# Patient Record
Sex: Female | Born: 1975 | Race: White | Hispanic: No | Smoking: Former smoker
Health system: Southern US, Community
[De-identification: ages and names within clinical notes are randomized; demographics above are authoritative.]

## PROBLEM LIST (undated history)

## (undated) DIAGNOSIS — E119 Type 2 diabetes mellitus without complications: Secondary | ICD-10-CM

## (undated) DIAGNOSIS — E7800 Pure hypercholesterolemia, unspecified: Secondary | ICD-10-CM

## (undated) DIAGNOSIS — I10 Essential (primary) hypertension: Secondary | ICD-10-CM

## (undated) HISTORY — PX: BACK SURGERY: SHX140

---

## 2001-11-22 ENCOUNTER — Other Ambulatory Visit: Admission: RE | Admit: 2001-11-22 | Discharge: 2001-11-22 | Payer: Self-pay | Admitting: Family Medicine

## 2005-03-29 ENCOUNTER — Emergency Department: Payer: Self-pay | Admitting: Emergency Medicine

## 2005-05-17 ENCOUNTER — Ambulatory Visit: Payer: Self-pay

## 2005-06-08 ENCOUNTER — Emergency Department (HOSPITAL_COMMUNITY): Admission: EM | Admit: 2005-06-08 | Discharge: 2005-06-08 | Payer: Self-pay | Admitting: Emergency Medicine

## 2005-06-15 ENCOUNTER — Ambulatory Visit: Payer: Self-pay | Admitting: Family Medicine

## 2005-11-07 ENCOUNTER — Other Ambulatory Visit: Payer: Self-pay

## 2005-11-07 ENCOUNTER — Emergency Department: Payer: Self-pay | Admitting: Emergency Medicine

## 2008-02-19 ENCOUNTER — Ambulatory Visit: Payer: Self-pay | Admitting: Family Medicine

## 2008-02-22 ENCOUNTER — Emergency Department: Payer: Self-pay | Admitting: Emergency Medicine

## 2015-05-03 ENCOUNTER — Other Ambulatory Visit: Payer: Self-pay | Admitting: Nurse Practitioner

## 2015-05-03 ENCOUNTER — Ambulatory Visit
Admission: RE | Admit: 2015-05-03 | Discharge: 2015-05-03 | Disposition: A | Discharge status: Home / Self Care | Payer: PRIVATE HEALTH INSURANCE | Source: Ambulatory Visit | Attending: Nurse Practitioner | Admitting: Nurse Practitioner

## 2015-05-03 DIAGNOSIS — R1011 Right upper quadrant pain: Secondary | ICD-10-CM

## 2015-05-03 DIAGNOSIS — E118 Type 2 diabetes mellitus with unspecified complications: Secondary | ICD-10-CM | POA: Diagnosis present

## 2015-05-03 DIAGNOSIS — E781 Pure hyperglyceridemia: Secondary | ICD-10-CM | POA: Diagnosis present

## 2016-04-13 ENCOUNTER — Encounter: Payer: Self-pay | Admitting: Emergency Medicine

## 2016-04-13 DIAGNOSIS — Z5321 Procedure and treatment not carried out due to patient leaving prior to being seen by health care provider: Secondary | ICD-10-CM | POA: Insufficient documentation

## 2016-04-13 DIAGNOSIS — F17290 Nicotine dependence, other tobacco product, uncomplicated: Secondary | ICD-10-CM | POA: Insufficient documentation

## 2016-04-13 LAB — POCT PREGNANCY, URINE: Preg Test, Ur: NEGATIVE

## 2016-04-13 NOTE — ED Notes (Signed)
Pt presents to ED with right sided neck pain that she woke up with on Wednesday morning that has gotten progressively worse. Pt states her pain is now radiating into her right arm. Tonight pt reports she has been feeling nauseated and has a decrease in appetite. Feeling "dizzy and lightheaded".

## 2016-04-14 ENCOUNTER — Telehealth: Payer: Self-pay | Admitting: Emergency Medicine

## 2016-04-14 ENCOUNTER — Emergency Department
Admission: EM | Admit: 2016-04-14 | Discharge: 2016-04-14 | Disposition: A | Discharge status: Home / Self Care | Payer: PRIVATE HEALTH INSURANCE | Attending: Emergency Medicine | Admitting: Emergency Medicine

## 2016-04-14 HISTORY — DX: Pure hypercholesterolemia, unspecified: E78.00

## 2016-04-14 HISTORY — DX: Type 2 diabetes mellitus without complications: E11.9

## 2016-04-14 HISTORY — DX: Essential (primary) hypertension: I10

## 2016-04-14 LAB — COMPREHENSIVE METABOLIC PANEL
ALBUMIN: 3.9 g/dL (ref 3.5–5.0)
ALT: 10 U/L — ABNORMAL LOW (ref 14–54)
AST: 14 U/L — AB (ref 15–41)
Alkaline Phosphatase: 43 U/L (ref 38–126)
Anion gap: 7 (ref 5–15)
BUN: 14 mg/dL (ref 6–20)
CHLORIDE: 106 mmol/L (ref 101–111)
CO2: 26 mmol/L (ref 22–32)
Calcium: 9 mg/dL (ref 8.9–10.3)
Creatinine, Ser: 0.74 mg/dL (ref 0.44–1.00)
GFR calc Af Amer: >60 mL/min (ref >60)
GLUCOSE: 139 mg/dL — AB (ref 65–99)
POTASSIUM: 3.2 mmol/L — AB (ref 3.5–5.1)
SODIUM: 139 mmol/L (ref 135–145)
Total Bilirubin: 0.4 mg/dL (ref 0.3–1.2)
Total Protein: 7.6 g/dL (ref 6.5–8.1)

## 2016-04-14 LAB — CBC WITH DIFFERENTIAL/PLATELET
BASOS ABS: 0.1 10*3/uL (ref 0–0.1)
BASOS PCT: 1 %
EOS ABS: 0.1 10*3/uL (ref 0–0.7)
EOS PCT: 1 %
HCT: 38.4 % (ref 35.0–47.0)
Hemoglobin: 13.1 g/dL (ref 12.0–16.0)
Lymphocytes Relative: 48 %
Lymphs Abs: 5.2 10*3/uL — ABNORMAL HIGH (ref 1.0–3.6)
MCH: 29.1 pg (ref 26.0–34.0)
MCHC: 34.2 g/dL (ref 32.0–36.0)
MCV: 85.2 fL (ref 80.0–100.0)
MONO ABS: 0.9 10*3/uL (ref 0.2–0.9)
Monocytes Relative: 9 %
Neutro Abs: 4.4 10*3/uL (ref 1.4–6.5)
Neutrophils Relative %: 41 %
PLATELETS: 322 10*3/uL (ref 150–440)
RBC: 4.51 MIL/uL (ref 3.80–5.20)
RDW: 13.4 % (ref 11.5–14.5)
WBC: 10.7 10*3/uL (ref 3.6–11.0)

## 2016-04-14 LAB — TROPONIN I

## 2016-04-14 NOTE — ED Notes (Signed)
Called patient due to lwot to inquire about condition and follow up plans. Says her sx are much better today.  I advised her to call pcp or return here for exam.  I explained that light headedness and nausea and arm heaviness were still concerning, and that heart issues could not be ruled out with out a provider exam.  She agrees to call her pcp and advise of her symptoms and that labs are available in epic.

## 2018-03-04 ENCOUNTER — Ambulatory Visit
Admission: EM | Admit: 2018-03-04 | Discharge: 2018-03-04 | Disposition: A | Discharge status: Home / Self Care | Payer: 59 | Attending: Family Medicine | Admitting: Family Medicine

## 2018-03-04 ENCOUNTER — Encounter: Payer: Self-pay | Admitting: *Deleted

## 2018-03-04 DIAGNOSIS — M5412 Radiculopathy, cervical region: Secondary | ICD-10-CM | POA: Diagnosis not present

## 2018-03-04 MED ORDER — KETOROLAC TROMETHAMINE 10 MG PO TABS: 10.0000 mg | ORAL_TABLET | Freq: Three times a day (TID) | ORAL | 0 refills | Status: DC | PRN

## 2018-03-04 NOTE — ED Triage Notes (Signed)
C/o neck pain that radiates to right arm.  Pt c/o numbness to fingers . Pt rates pain 10/10.  Pain started Saturday.

## 2018-03-04 NOTE — ED Provider Notes (Signed)
[facility]    CSN: [id number] Arrival date & time: 03/04/18  1529     History   Chief Complaint Chief Complaint  Patient presents with  . Neck Injury    HPI Nicole Phillips is a 42 y.o. female.   42 yo female with a c/o 2 days of neck pain radiating down her right arm. Patient denies any injuries. States woke up yesterday morning with the neck pain. Denies any heavy lifting, falls, direct trauma. States pain is sharp radiating down her right arm with occasional tingling down the arm. 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    Neck Injury     Past Medical History:  Diagnosis Date  . Diabetes mellitus without complication (HCC)   . Hypercholesteremia   . Hypertension     There are no active problems to display for this patient.   Past Surgical History:  Procedure Laterality Date  . BACK SURGERY      OB History   None      Home Medications    Prior to Admission medications   Medication Sig Start Date End Date Taking? Authorizing Provider  divalproex (DEPAKOTE) 500 MG DR tablet Take 500 mg by mouth 2 (two) times daily.   Yes [provider]  metFORMIN (GLUCOPHAGE) 500 MG tablet Take by mouth 2 (two) times daily with a meal.   Yes [provider]  pioglitazone (ACTOS) 30 MG tablet Take 30 mg by mouth daily.   Yes [provider]  ketorolac (TORADOL) 10 MG tablet Take 1 tablet (10 mg total) by mouth every 8 (eight) hours as needed. 03/04/18   Payton Mccallum, MD    Family History History reviewed. No pertinent family history.  Social History Social History   Tobacco Use  . Smoking status: Former Games developer  . Smokeless tobacco: Current User  Substance Use Topics  . Alcohol use: No  . Drug use: No     Allergies   Sulfa antibiotics   Review of Systems Review of Systems   Physical Exam Triage Vital Signs ED Triage Vitals  Enc Vitals Group     BP 03/04/18  1547 129/67     Pulse Rate 03/04/18 1547 96     Resp 03/04/18 1547 18     Temp 03/04/18 1547 98.2 F (36.8 C)     Temp Source 03/04/18 1547 Oral     SpO2 03/04/18 1547 100 %     Weight 03/04/18 1544 230 lb (104.3 kg)     Height 03/04/18 1544  (1.753 m)     Head Circumference --      Peak Flow --      Pain Score 03/04/18 1543 10     Pain Loc --      Pain Edu? --      Excl. in GC? --    No data found.  Updated Vital Signs BP 129/67 (BP Location: Left Arm)   Pulse 96   Temp 98.2 F (36.8 C) (Oral)   Resp 18   Ht  (1.753 m)   Wt 230 lb (104.3 kg)   LMP 03/04/2018 (Exact Date)   SpO2 100%   BMI 33.97 kg/m   Visual Acuity Right Eye Distance:   Left Eye Distance:   Bilateral Distance:    Right Eye Near:   Left Eye Near:    Bilateral Near:     Physical Exam  Constitutional:  She is oriented to person, place, and time. She appears well-developed and well-nourished. No distress.  Cardiovascular: Intact distal pulses.  Musculoskeletal:       Cervical back: She exhibits decreased range of motion (secondary to pain), tenderness (mild over the cervical paraspinous) and pain. She exhibits no bony tenderness, no swelling, no edema, no deformity, no laceration, no spasm and normal pulse.  Neurological: She is alert and oriented to person, place, and time. She displays normal reflexes. No cranial nerve deficit or sensory deficit. She exhibits normal muscle tone. Coordination normal.  Skin: She is not diaphoretic.  Nursing note and vitals reviewed.    UC Treatments / Results  Labs (all labs ordered are listed, but only abnormal results are displayed) Labs Reviewed - No data to display  EKG None  Radiology No results found.  Procedures Procedures (including critical care time)  Medications Ordered in UC Medications - No data to display  Initial Impression / Assessment and Plan / UC Course  I have reviewed the triage vital signs and the nursing  notes.  Pertinent labs & imaging results that were available during my care of the patient were reviewed by me and considered in my medical decision making (see chart for details).      Final Clinical Impressions(s) / UC Diagnoses   Final diagnoses:  Acute cervical radiculopathy    ED Prescriptions    Medication Sig Dispense Auth. Provider   ketorolac (TORADOL) 10 MG tablet Take 1 tablet (10 mg total) by mouth every 8 (eight) hours as needed. 15 tablet Payton Mccallum, MD     1. diagnosis reviewed with patient 2. rx as per orders above; reviewed possible side effects, interactions, risks and benefits; patient refuses stronger pain medication or muscle relaxers 3. Recommend supportive treatment with heat to area 4. Follow-up prn if symptoms worsen or don't improve; discussed with patient if no improvement, she may need a referral from PCP for MRI and/or specialist   ontrolled Substance Prescriptions [place] Controlled Substance Registry consulted? Not Applicable   Payton Mccallum, MD 03/04/18 1650

## 2018-04-18 ENCOUNTER — Other Ambulatory Visit: Payer: Self-pay | Admitting: Orthopedic Surgery

## 2018-04-18 DIAGNOSIS — M5030 Other cervical disc degeneration, unspecified cervical region: Secondary | ICD-10-CM

## 2018-04-18 DIAGNOSIS — M5412 Radiculopathy, cervical region: Secondary | ICD-10-CM

## 2018-04-18 DIAGNOSIS — M50123 Cervical disc disorder at C6-C7 level with radiculopathy: Secondary | ICD-10-CM

## 2018-04-22 ENCOUNTER — Ambulatory Visit
Admission: RE | Admit: 2018-04-22 | Discharge: 2018-04-22 | Disposition: A | Discharge status: Home / Self Care | Payer: 59 | Source: Ambulatory Visit | Attending: Orthopedic Surgery | Admitting: Orthopedic Surgery

## 2018-04-22 DIAGNOSIS — M5412 Radiculopathy, cervical region: Secondary | ICD-10-CM

## 2018-04-22 DIAGNOSIS — M50123 Cervical disc disorder at C6-C7 level with radiculopathy: Secondary | ICD-10-CM

## 2018-04-22 DIAGNOSIS — M5030 Other cervical disc degeneration, unspecified cervical region: Secondary | ICD-10-CM

## 2018-05-07 ENCOUNTER — Other Ambulatory Visit: Payer: Self-pay | Admitting: Physical Medicine and Rehabilitation

## 2018-05-07 DIAGNOSIS — R9389 Abnormal findings on diagnostic imaging of other specified body structures: Secondary | ICD-10-CM

## 2018-05-10 ENCOUNTER — Ambulatory Visit: Admission: RE | Admit: 2018-05-10 | Payer: 59 | Source: Ambulatory Visit

## 2018-06-14 ENCOUNTER — Ambulatory Visit
Admission: RE | Admit: 2018-06-14 | Discharge: 2018-06-14 | Disposition: A | Discharge status: Home / Self Care | Payer: BLUE CROSS/BLUE SHIELD | Source: Ambulatory Visit | Attending: Physical Medicine and Rehabilitation | Admitting: Physical Medicine and Rehabilitation

## 2018-06-14 ENCOUNTER — Encounter (INDEPENDENT_AMBULATORY_CARE_PROVIDER_SITE_OTHER): Payer: Self-pay

## 2018-06-14 DIAGNOSIS — R590 Localized enlarged lymph nodes: Secondary | ICD-10-CM | POA: Diagnosis not present

## 2018-06-14 DIAGNOSIS — M8568 Other cyst of bone, other site: Secondary | ICD-10-CM | POA: Diagnosis not present

## 2018-06-14 DIAGNOSIS — R9389 Abnormal findings on diagnostic imaging of other specified body structures: Secondary | ICD-10-CM

## 2018-06-14 DIAGNOSIS — R937 Abnormal findings on diagnostic imaging of other parts of musculoskeletal system: Secondary | ICD-10-CM | POA: Insufficient documentation

## 2018-06-14 MED ORDER — IOPAMIDOL (ISOVUE-300) INJECTION 61%
75.0000 mL | Freq: Once | INTRAVENOUS | Status: AC | PRN
  Administered 2018-06-14: 75 mL via INTRAVENOUS

## 2018-06-17 ENCOUNTER — Other Ambulatory Visit: Payer: Self-pay

## 2018-06-17 ENCOUNTER — Encounter: Payer: Self-pay | Admitting: *Deleted

## 2018-06-17 ENCOUNTER — Emergency Department
Admission: EM | Admit: 2018-06-17 | Discharge: 2018-06-18 | Disposition: A | Discharge status: Home / Self Care | Payer: BLUE CROSS/BLUE SHIELD | Attending: Emergency Medicine | Admitting: Emergency Medicine

## 2018-06-17 DIAGNOSIS — S39012A Strain of muscle, fascia and tendon of lower back, initial encounter: Secondary | ICD-10-CM

## 2018-06-17 DIAGNOSIS — M62830 Muscle spasm of back: Secondary | ICD-10-CM | POA: Diagnosis not present

## 2018-06-17 DIAGNOSIS — X500XXA Overexertion from strenuous movement or load, initial encounter: Secondary | ICD-10-CM | POA: Diagnosis not present

## 2018-06-17 DIAGNOSIS — E119 Type 2 diabetes mellitus without complications: Secondary | ICD-10-CM | POA: Insufficient documentation

## 2018-06-17 DIAGNOSIS — M5441 Lumbago with sciatica, right side: Secondary | ICD-10-CM | POA: Insufficient documentation

## 2018-06-17 DIAGNOSIS — Z87891 Personal history of nicotine dependence: Secondary | ICD-10-CM | POA: Diagnosis not present

## 2018-06-17 DIAGNOSIS — Y9389 Activity, other specified: Secondary | ICD-10-CM | POA: Insufficient documentation

## 2018-06-17 DIAGNOSIS — Y990 Civilian activity done for income or pay: Secondary | ICD-10-CM | POA: Diagnosis not present

## 2018-06-17 DIAGNOSIS — Y9259 Other trade areas as the place of occurrence of the external cause: Secondary | ICD-10-CM | POA: Insufficient documentation

## 2018-06-17 DIAGNOSIS — I10 Essential (primary) hypertension: Secondary | ICD-10-CM | POA: Diagnosis not present

## 2018-06-17 DIAGNOSIS — Z7984 Long term (current) use of oral hypoglycemic drugs: Secondary | ICD-10-CM | POA: Insufficient documentation

## 2018-06-17 DIAGNOSIS — M5489 Other dorsalgia: Secondary | ICD-10-CM | POA: Diagnosis present

## 2018-06-17 NOTE — ED Triage Notes (Addendum)
Pt reports herniated c6 and c7. Mri approx 1 month ago.    Pt has lower back pain today. Pt was lifting water.  Denies urinary sx  Pt alert.

## 2018-06-17 NOTE — ED Notes (Signed)
Pt states that she moved something at work this morning and hurt her lower back. States that she couldn't even drive home after work today and she has been Surveyor, mineralslaying in bed all day. Reports that her C6- and C7 is herniated and she had an epidural on two Fridays ago. Pt is alert and oriented x4. Family at bedside

## 2018-06-18 ENCOUNTER — Emergency Department: Payer: BLUE CROSS/BLUE SHIELD

## 2018-06-18 LAB — POCT PREGNANCY, URINE: Preg Test, Ur: NEGATIVE

## 2018-06-18 MED ORDER — LIDOCAINE 5 % EX PTCH
1.0000 | MEDICATED_PATCH | CUTANEOUS | Status: DC
  Filled 2018-06-18: qty 1

## 2018-06-18 MED ORDER — KETOROLAC TROMETHAMINE 60 MG/2ML IM SOLN
60.0000 mg | Freq: Once | INTRAMUSCULAR | Status: DC
  Filled 2018-06-18: qty 2

## 2018-06-18 MED ORDER — PREDNISONE 10 MG (21) PO TBPK: ORAL_TABLET | ORAL | 0 refills | Status: DC

## 2018-06-18 MED ORDER — LIDOCAINE 5 % EX PTCH: 1.0000 | MEDICATED_PATCH | Freq: Two times a day (BID) | CUTANEOUS | 0 refills | Status: AC

## 2018-06-18 NOTE — Discharge Instructions (Addendum)
Please follow-up with your primary care physician for further evaluation of your back pain. °

## 2018-06-18 NOTE — ED Provider Notes (Signed)
[facility] Emergency Department Provider Note   ____________________________________________   First MD Initiated Contact with Patient 06/18/18 0040     (approximate)  I have reviewed the triage vital signs and the nursing notes.   HISTORY  Chief Complaint Back Pain    HPI Nicole Phillips is a 42 y.o. female who comes into the hospital today with some back pain.  The patient is here with her son but reports that at work today she injured her back.  The patient states that she moved something that she should not have moved and her back has been getting progressively worse since she injured it.  The patient took some meloxicam this morning but reports that she still been having pain burning down the front of her leg into the mid calf.  The patient states that she overdid it at work as well.  The patient rates her pain an 8 out of 10 intensity.  She has muscle relaxers at home but she does not like taking it.  The patient is mainly concerned about a possible fracture which is why she came into the hospital tonight.  She states that it hurts when she tries to stand or sit on her right leg.  It is also worse to move.   Past Medical History:  Diagnosis Date  . Diabetes mellitus without complication (HCC)   . Hypercholesteremia   . Hypertension     There are no active problems to display for this patient.   Past Surgical History:  Procedure Laterality Date  . BACK SURGERY      Prior to Admission medications   Medication Sig Start Date End Date Taking? Authorizing Provider  divalproex (DEPAKOTE) 500 MG DR tablet Take 500 mg by mouth 2 (two) times daily.    [provider]  ketorolac (TORADOL) 10 MG tablet Take 1 tablet (10 mg total) by mouth every 8 (eight) hours as needed. 03/04/18   Payton Mccallum, MD  lidocaine (LIDODERM) 5 % Place 1 patch onto the skin every 12 (twelve) hours. Remove & Discard patch within 12 hours or as directed by MD 06/18/18  06/18/19  Rebecka Apley, MD  metFORMIN (GLUCOPHAGE) 500 MG tablet Take by mouth 2 (two) times daily with a meal.    [provider]  pioglitazone (ACTOS) 30 MG tablet Take 30 mg by mouth daily.    [provider]  predniSONE (STERAPRED UNI-PAK 21 TAB) 10 MG (21) TBPK tablet Take 6 tabs on day 1 Take 5 tabs on day 2 Take 4 tabs on day 3 Take 3 tabs on day 4 Take 2 tabs on day 5 Take 1 tab on day 6 06/18/18   Rebecka Apley, MD    Allergies Sulfa antibiotics  No family history on file.  Social History Social History   Tobacco Use  . Smoking status: Former Games developer  . Smokeless tobacco: Current User  Substance Use Topics  . Alcohol use: No  . Drug use: No    Review of Systems  Constitutional: No fever/chills Eyes: No visual changes. ENT: No sore throat. Cardiovascular: Denies chest pain. Respiratory: Denies shortness of breath. Gastrointestinal: No abdominal pain.  No nausea, no vomiting.  No diarrhea.  No constipation. Genitourinary: Negative for dysuria. Musculoskeletal:  back pain. Skin: Negative for rash. Neurological: Negative for headaches, focal weakness or numbness.   ____________________________________________   PHYSICAL EXAM:  VITAL SIGNS: ED Triage Vitals  Enc Vitals Group     BP 06/17/18  2307 (!) 127/91     Pulse Rate 06/17/18 2307 85     Resp 06/17/18 2307 20     Temp 06/17/18 2307 97.7 F (36.5 C)     Temp Source 06/17/18 2307 Oral     SpO2 06/17/18 2307 98 %     Weight 06/17/18 2308 223 lb (101.2 kg)     Height 06/17/18 2308 5\' 9"  (1.753 m)     Head Circumference --      Peak Flow --      Pain Score 06/17/18 2308 10     Pain Loc --      Pain Edu? --      Excl. in GC? --     Constitutional: Alert and oriented. Well appearing and in moderate distress. Eyes: Conjunctivae are normal. PERRL. EOMI. Head: Atraumatic. Nose: No congestion/rhinnorhea. Mouth/Throat: Mucous membranes are moist.  Oropharynx  non-erythematous. Cardiovascular: Normal rate, regular rhythm. Grossly normal heart sounds.  Good peripheral circulation. Respiratory: Normal respiratory effort.  No retractions. Lungs CTAB. Gastrointestinal: Soft and nontender. No distention. Positive bowel sounds Musculoskeletal: Tenderness to palpation of lower lumbar spine with some positive straight leg raise on the right patient also has some mild SI joint tenderness to palpation Neurologic:  Normal speech and language.  No weakness to right lower extremity but pain with movement Skin:  Skin is warm, dry and intact.  Psychiatric: Mood and affect are normal.   ____________________________________________   LABS (all labs ordered are listed, but only abnormal results are displayed)  Labs Reviewed  POCT PREGNANCY, URINE   ____________________________________________  EKG  none ____________________________________________  RADIOLOGY  ED MD interpretation: Lumbar spine x-ray: Negative  Official radiology report(s): Dg Lumbar Spine 2-3 Views  Result Date: 06/18/2018 CLINICAL DATA:  Low back pain today after lifting water. EXAM: LUMBAR SPINE - 2-3 VIEW COMPARISON:  None. FINDINGS: There is no evidence of lumbar spine fracture. Alignment is normal. Intervertebral disc spaces are maintained. IMPRESSION: Negative. Electronically Signed   By: Burman NievesWilliam  Stevens M.D.   On: 06/18/2018 02:40    ____________________________________________   PROCEDURES  Procedure(s) performed: None  Procedures  Critical Care performed: No  ____________________________________________   INITIAL IMPRESSION / ASSESSMENT AND PLAN / ED COURSE  As part of my medical decision making, I reviewed the following data within the electronic MEDICAL RECORD NUMBER Notes from prior ED visits and [place] Controlled Substance Database   This is a 42 year old female who comes into the hospital today after injuring her back at work.  The patient states that she is  concerned about possible fracture given her discomfort.  I did order a Lidoderm patch and Toradol for the patient but the patient refused the Toradol.  I did perform an x-ray which did not show any fractures.  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  I will discharge the patient with some steroids as well as Lidoderm patches.  She is to follow back up with her primary care physician.      ____________________________________________   FINAL CLINICAL IMPRESSION(S) / ED DIAGNOSES  Final diagnoses:  Acute bilateral low back pain with right-sided sciatica  Muscle spasm of back  Strain of lumbar region, initial encounter     ED Discharge Orders         Ordered    lidocaine (LIDODERM) 5 %  Every 12 hours     06/18/18 0258    predniSONE (STERAPRED UNI-PAK 21 TAB) 10 MG (21) TBPK tablet  06/18/18 0258           Note:  This document was prepared using Dragon voice recognition software and may include unintentional dictation errors.    Rebecka ApleyWebster, Allison P, MD 06/18/18 [phone]

## 2020-09-14 ENCOUNTER — Other Ambulatory Visit: Payer: Self-pay | Admitting: Nurse Practitioner

## 2020-09-14 DIAGNOSIS — Z1231 Encounter for screening mammogram for malignant neoplasm of breast: Secondary | ICD-10-CM

## 2020-10-20 ENCOUNTER — Ambulatory Visit
Admission: RE | Admit: 2020-10-20 | Discharge: 2020-10-20 | Disposition: A | Discharge status: Home / Self Care | Payer: BC Managed Care – PPO | Source: Ambulatory Visit | Attending: Nurse Practitioner | Admitting: Nurse Practitioner

## 2020-10-20 ENCOUNTER — Other Ambulatory Visit: Payer: Self-pay

## 2020-10-20 DIAGNOSIS — Z1231 Encounter for screening mammogram for malignant neoplasm of breast: Secondary | ICD-10-CM | POA: Diagnosis present

## 2020-11-12 ENCOUNTER — Ambulatory Visit
Admission: EM | Admit: 2020-11-12 | Discharge: 2020-11-12 | Disposition: A | Discharge status: Home / Self Care | Payer: BC Managed Care – PPO | Attending: Physician Assistant | Admitting: Physician Assistant

## 2020-11-12 ENCOUNTER — Other Ambulatory Visit: Payer: Self-pay

## 2020-11-12 ENCOUNTER — Encounter: Payer: Self-pay | Admitting: Emergency Medicine

## 2020-11-12 DIAGNOSIS — N3090 Cystitis, unspecified without hematuria: Secondary | ICD-10-CM | POA: Diagnosis not present

## 2020-11-12 DIAGNOSIS — M545 Low back pain, unspecified: Secondary | ICD-10-CM

## 2020-11-12 DIAGNOSIS — K2970 Gastritis, unspecified, without bleeding: Secondary | ICD-10-CM | POA: Diagnosis not present

## 2020-11-12 DIAGNOSIS — M546 Pain in thoracic spine: Secondary | ICD-10-CM

## 2020-11-12 DIAGNOSIS — K2990 Gastroduodenitis, unspecified, without bleeding: Secondary | ICD-10-CM

## 2020-11-12 LAB — URINALYSIS, COMPLETE (UACMP) WITH MICROSCOPIC
Bilirubin Urine: NEGATIVE
Glucose, UA: 500 mg/dL — AB
Nitrite: NEGATIVE
Protein, ur: NEGATIVE mg/dL
RBC / HPF: NONE SEEN RBC/hpf (ref 0–5)
Specific Gravity, Urine: <1.005 — ABNORMAL LOW (ref 1.005–1.030)
pH: 5.5 (ref 5.0–8.0)

## 2020-11-12 MED ORDER — NITROFURANTOIN MONOHYD MACRO 100 MG PO CAPS: 100.0000 mg | ORAL_CAPSULE | Freq: Two times a day (BID) | ORAL | 0 refills | Status: DC

## 2020-11-12 MED ORDER — FLUCONAZOLE 150 MG PO TABS: 150.0000 mg | ORAL_TABLET | Freq: Every day | ORAL | 0 refills | Status: DC

## 2020-11-12 NOTE — ED Provider Notes (Signed)
[facility]    CSN: [id number] Arrival date & time: 11/12/20  1416      History   Chief Complaint Chief Complaint  Patient presents with  . Back Pain    HPI Nicole Phillips is a 45 y.o. female who started having burning like pain across lower lumbar-sacral region 2 days ago and denies injuring herself. Then today started pain on the R mid thorax area which is described as tightness and is constant. Her boss advised her to get a UA chekced. Has hx of asymptomatic UTls. She denies dysuria, frequency more than usual from her DM or urgency Had neg covid test yesterday  Past Medical History:  Diagnosis Date  . Diabetes mellitus without complication (HCC)   . Hypercholesteremia   . Hypertension     There are no problems to display for this patient.   Past Surgical History:  Procedure Laterality Date  . BACK SURGERY      OB History   No obstetric history on file.      Home Medications    Prior to Admission medications   Medication Sig Start Date End Date Taking? Authorizing Provider  atorvastatin (LIPITOR) 10 MG tablet Take 10 mg by mouth daily. 08/31/20  Yes [provider]  busPIRone (BUSPAR) 5 MG tablet TAKE 1 TABLET BY MOUTH EVERY DAY AT NIGHT 05/26/20  Yes [provider]  divalproex (DEPAKOTE) 500 MG DR tablet Take 500 mg by mouth 2 (two) times daily.   Yes [provider]  Dulaglutide (TRULICITY) 0.75 MG/0.5ML SOPN INJECT 0.5 MLS (0.75 MG TOTAL) SUBCUTANEOUSLY ONCE A WEEK 07/23/20  Yes [provider]  fluconazole (DIFLUCAN) 150 MG tablet Take 1 tablet (150 mg total) by mouth daily. 11/12/20  Yes Rodriguez-Southworth, Nettie Elm, PA-C  lisinopril (ZESTRIL) 10 MG tablet Take 1 tablet by mouth daily. 08/31/20  Yes [provider]  metFORMIN (GLUCOPHAGE) 500 MG tablet Take by mouth 2 (two) times daily with a meal.   Yes [provider]  nitrofurantoin, macrocrystal-monohydrate, (MACROBID) 100 MG capsule Take 1  capsule (100 mg total) by mouth 2 (two) times daily. 11/12/20  Yes Rodriguez-Southworth, Nettie Elm, PA-C  pioglitazone (ACTOS) 30 MG tablet Take 30 mg by mouth daily.   Yes [provider]    Family History History reviewed. No pertinent family history.  Social History Social History   Tobacco Use  . Smoking status: Former Games developer  . Smokeless tobacco: Current User  Vaping Use  . Vaping Use: Never used  Substance Use Topics  . Alcohol use: No  . Drug use: No     Allergies   Sulfa antibiotics   Review of Systems Review of Systems  Constitutional: Negative for activity change, appetite change, chills, diaphoresis and fever.  HENT: Negative for congestion.   Respiratory: Negative for cough.   Gastrointestinal: Positive for abdominal pain and constipation. Negative for diarrhea, nausea and vomiting.       Has felt a lot of bloating this past week and discomfort on upper abdomen. Used to take Prilosec in the past, but has not in a while. Has not noticed heart burn lately.   Genitourinary: Negative for difficulty urinating, dysuria, flank pain, frequency, hematuria and vaginal discharge.  Musculoskeletal: Positive for back pain. Negative for gait problem and myalgias.  Skin: Negative for rash.  Neurological: Negative for weakness and numbness.     Physical Exam Triage Vital Signs ED Triage Vitals  Enc Vitals Group     BP 11/12/20 1557 133/85  Pulse Rate 11/12/20 1557 83     Resp 11/12/20 1557 18     Temp 11/12/20 1557 98.1 F (36.7 C)     Temp Source 11/12/20 1557 Oral     SpO2 11/12/20 1557 100 %     Weight 11/12/20 1555 251 lb (113.9 kg)     Height 11/12/20 1555 5\' 9"  (1.753 m)     Head Circumference --      Peak Flow --      Pain Score 11/12/20 1554 7     Pain Loc --      Pain Edu? --      Excl. in [place]? --    No data found.  Updated Vital Signs BP 133/85 (BP Location: Left Arm)   Pulse 83   Temp 98.1 F (36.7 C) (Oral)   Resp 18   Ht 5\' 9"  (1.753  m)   Wt 251 lb (113.9 kg)   LMP 10/25/2020   SpO2 100%   BMI 37.07 kg/m   Visual Acuity Right Eye Distance:   Left Eye Distance:   Bilateral Distance:    Right Eye Near:   Left Eye Near:    Bilateral Near:    Physical Exam Vitals and nursing note reviewed.  Constitutional:      General: She is not in acute distress.    Appearance: She is not toxic-appearing.  HENT:     Head: Normocephalic.     Right Ear: External ear normal.     Left Ear: External ear normal.  Eyes:     General: No scleral icterus.    Conjunctiva/sclera: Conjunctivae normal.  Pulmonary:     Effort: Pulmonary effort is normal.  Abdominal:     General: Bowel sounds are normal.     Palpations: Abdomen is soft. There is no mass.     Tenderness: Has mild epigastric tenderness. There is no guarding or rebound.     Comments: neg CVA tenderness   Musculoskeletal:     Comments: BACK- has mild tenderness across sacral region, but more tender and tense on R mid thoracic muscular  Region. Spine ROM is normal, but L lateral felxion and anterior flexion >80 degrees provoked her pain. Has neg SLR   Skin:    General: Skin is warm and dry.     Findings: No rash.  Neurological:     Mental Status: She is alert and oriented to person, place, and time.     Gait: Gait normal.  Psychiatric:        Mood and Affect: Mood normal.        Behavior: Behavior normal.        Thought Content: Thought content normal.        Judgment: Judgment normal.    UC Treatments / Results  Labs (all labs ordered are listed, but only abnormal results are displayed) Labs Reviewed  URINALYSIS, COMPLETE (UACMP) WITH MICROSCOPIC - Abnormal; Notable for the following components:      Result Value   APPearance HAZY (*)    Specific Gravity, Urine <1.005 (*)    Glucose, UA 500 (*)    Hgb urine dipstick TRACE (*)    Ketones, ur TRACE (*)    Leukocytes,Ua MODERATE (*)    Bacteria, UA FEW (*)    All other components within normal limits     EKG   Radiology No results found.  Procedures Procedures (including critical care time)  Medications Ordered in UC Medications - No data to display  Initial Impression / Assessment and Plan / UC Course  I have reviewed the triage vital signs and the nursing notes. Has muscular R mid thoracic pain and lumbo-sacral pain, gastritis and possibly UTI I sent urine for a culture. She was placed on Macrobid and diflucan as noted. She declined muscle relaxer's. Will do ice and heat and stretches I taught her today.  I advised her to restart her Prilosec for a couple of week to see if her epigastric pain improved. See instructions  Final Clinical Impressions(s) / UC Diagnoses   Final diagnoses:  Cystitis  Gastritis and gastroduodenitis     Discharge Instructions     Get back to your prilosec for 2 weeks for the stomach pain and follow up with your doctor  if you are not better in regards to upper abdomen pain    ED Prescriptions    Medication Sig Dispense Auth. Provider   nitrofurantoin, macrocrystal-monohydrate, (MACROBID) 100 MG capsule Take 1 capsule (100 mg total) by mouth 2 (two) times daily. 14 capsule Rodriguez-Southworth, Bernadett Milian, PA-C   fluconazole (DIFLUCAN) 150 MG tablet Take 1 tablet (150 mg total) by mouth daily. 2 tablet Rodriguez-Southworth, Nettie Elm, PA-C     PDMP not reviewed this encounter.   Garey Ham, [place] 11/13/20 [phone]

## 2020-11-12 NOTE — ED Triage Notes (Signed)
Patient c/o back pain that started 2 days ago. She denies urinary symptoms but states her PCP told her to come here to be checked for a UTI.

## 2020-11-12 NOTE — Discharge Instructions (Signed)
Get back to your prilosec for 2 weeks for the stomach pain and follow up with your doctor  if you are not better in regards to upper abdomen pain

## 2020-12-07 ENCOUNTER — Ambulatory Visit (INDEPENDENT_AMBULATORY_CARE_PROVIDER_SITE_OTHER): Payer: BC Managed Care – PPO

## 2020-12-07 ENCOUNTER — Ambulatory Visit
Admission: RE | Admit: 2020-12-07 | Discharge: 2020-12-07 | Disposition: A | Discharge status: Home / Self Care | Payer: BC Managed Care – PPO | Source: Ambulatory Visit | Attending: Physician Assistant | Admitting: Physician Assistant

## 2020-12-07 ENCOUNTER — Other Ambulatory Visit: Payer: Self-pay

## 2020-12-07 VITALS — BP 136/85 | HR 95 | Temp 98.2°F | Resp 18 | Ht 69.0 in | Wt 244.0 lb

## 2020-12-07 DIAGNOSIS — U071 COVID-19: Secondary | ICD-10-CM | POA: Diagnosis not present

## 2020-12-07 DIAGNOSIS — J1282 Pneumonia due to coronavirus disease 2019: Secondary | ICD-10-CM | POA: Insufficient documentation

## 2020-12-07 DIAGNOSIS — R0602 Shortness of breath: Secondary | ICD-10-CM | POA: Insufficient documentation

## 2020-12-07 DIAGNOSIS — R000 Tachycardia, unspecified: Secondary | ICD-10-CM | POA: Diagnosis not present

## 2020-12-07 DIAGNOSIS — R059 Cough, unspecified: Secondary | ICD-10-CM | POA: Insufficient documentation

## 2020-12-07 LAB — COMPREHENSIVE METABOLIC PANEL
ALT: 20 U/L (ref 0–44)
AST: 23 U/L (ref 15–41)
Albumin: 3.7 g/dL (ref 3.5–5.0)
Alkaline Phosphatase: 61 U/L (ref 38–126)
Anion gap: 10 (ref 5–15)
BUN: 13 mg/dL (ref 6–20)
CO2: 26 mmol/L (ref 22–32)
Calcium: 8.6 mg/dL — ABNORMAL LOW (ref 8.9–10.3)
Chloride: 99 mmol/L (ref 98–111)
Creatinine, Ser: 0.82 mg/dL (ref 0.44–1.00)
GFR, Estimated: >60 mL/min (ref >60)
Glucose, Bld: 253 mg/dL — ABNORMAL HIGH (ref 70–99)
Potassium: 3.7 mmol/L (ref 3.5–5.1)
Sodium: 135 mmol/L (ref 135–145)
Total Bilirubin: 0.4 mg/dL (ref 0.3–1.2)
Total Protein: 8 g/dL (ref 6.5–8.1)

## 2020-12-07 LAB — CBC WITH DIFFERENTIAL/PLATELET
Abs Immature Granulocytes: 0.05 10*3/uL (ref 0.00–0.07)
Basophils Absolute: 0 10*3/uL (ref 0.0–0.1)
Basophils Relative: 0 %
Eosinophils Absolute: 0.1 10*3/uL (ref 0.0–0.5)
Eosinophils Relative: 1 %
HCT: 41.1 % (ref 36.0–46.0)
Hemoglobin: 13.1 g/dL (ref 12.0–15.0)
Immature Granulocytes: 1 %
Lymphocytes Relative: 42 %
Lymphs Abs: 3.1 10*3/uL (ref 0.7–4.0)
MCH: 27.8 pg (ref 26.0–34.0)
MCHC: 31.9 g/dL (ref 30.0–36.0)
MCV: 87.1 fL (ref 80.0–100.0)
Monocytes Absolute: 0.8 10*3/uL (ref 0.1–1.0)
Monocytes Relative: 10 %
Neutro Abs: 3.4 10*3/uL (ref 1.7–7.7)
Neutrophils Relative %: 46 %
Platelets: 297 10*3/uL (ref 150–400)
RBC: 4.72 MIL/uL (ref 3.87–5.11)
RDW: 14.2 % (ref 11.5–15.5)
WBC: 7.4 10*3/uL (ref 4.0–10.5)
nRBC: 0 % (ref 0.0–0.2)

## 2020-12-07 LAB — GLUCOSE, CAPILLARY: Glucose-Capillary: 227 mg/dL — ABNORMAL HIGH (ref 70–99)

## 2020-12-07 MED ORDER — ALBUTEROL SULFATE HFA 108 (90 BASE) MCG/ACT IN AERS: 1.0000 | INHALATION_SPRAY | Freq: Four times a day (QID) | RESPIRATORY_TRACT | 0 refills | Status: AC | PRN

## 2020-12-07 NOTE — Discharge Instructions (Signed)
Your x-ray shows COVID pneumonia. This is treated with supportive care. Increase rest and fluids. Use inhaler if you are feeling short of breath. Check O2 and if below 93% call EMS or go to ED. Can take Mucinex to help break up mucus.  COVID-19 INFECTION: The incubation period of COVID-19 is approximately 14 days after exposure, with most symptoms developing in roughly 4-5 days. Symptoms may range in severity from mild to critically severe. Roughly 80% of those infected will have mild symptoms. People of any age may become infected with COVID-19 and have the ability to transmit the virus. The most common symptoms include: fever, fatigue, cough, body aches, headaches, sore throat, nasal congestion, shortness of breath, nausea, vomiting, diarrhea, changes in smell and/or taste.    COURSE OF ILLNESS Some patients may begin with mild disease which can progress quickly into critical symptoms. If your symptoms are worsening please call ahead to the Emergency Department and proceed there for further treatment. Recovery time appears to be roughly 1-2 weeks for mild symptoms and 3-6 weeks for severe disease.   GO IMMEDIATELY TO ER FOR FEVER YOU ARE UNABLE TO GET DOWN WITH TYLENOL, BREATHING PROBLEMS, CHEST PAIN, FATIGUE, LETHARGY, INABILITY TO EAT OR DRINK, ETC  QUARANTINE AND ISOLATION: To help decrease the spread of COVID-19 please remain isolated if you have COVID infection or are highly suspected to have COVID infection. This means -stay home and isolate to one room in the home if you live with others. Do not share a bed or bathroom with others while ill, sanitize and wipe down all countertops and keep common areas clean and disinfected. Stay home for 5 days. If you have no symptoms or your symptoms are resolving after 5 days, you can leave your house. Continue to wear a mask around others for 5 additional days. If you have been in close contact (within 6 feet) of someone diagnosed with COVID 19, you are  advised to quarantine in your home for 14 days as symptoms can develop anywhere from 2-14 days after exposure to the virus. If you develop symptoms, you  must isolate.  Most current guidelines for COVID after exposure -unvaccinated: isolate 5 days and strict mask use x 5 days. Test on day 5 is possible -vaccinated: wear mask x 10 days if symptoms do not develop -You do not necessarily need to be tested for COVID if you have + exposure and  develop symptoms. Just isolate at home x10 days from symptom onset During this global pandemic, CDC advises to practice social distancing, try to stay at least 71ft away from others at all times. Wear a face covering. Wash and sanitize your hands regularly and avoid going anywhere that is not necessary.  KEEP IN MIND THAT THE COVID TEST IS NOT 100% ACCURATE AND YOU SHOULD STILL DO EVERYTHING TO PREVENT POTENTIAL SPREAD OF VIRUS TO OTHERS (WEAR MASK, WEAR GLOVES, WASH HANDS AND SANITIZE REGULARLY). IF INITIAL TEST IS NEGATIVE, THIS MAY NOT MEAN YOU ARE DEFINITELY NEGATIVE. MOST ACCURATE TESTING IS DONE 5-7 DAYS AFTER EXPOSURE.   It is not advised by CDC to get re-tested after receiving a positive COVID test since you can still test positive for weeks to months after you have already cleared the virus.   *If you have not been vaccinated for COVID, I strongly suggest you consider getting vaccinated as long as there are no contraindications.

## 2020-12-07 NOTE — ED Triage Notes (Signed)
Patient in today c/o sob and tachycardia x 1-2 days. Patient diagnosed with covid ~2 weeks ago. Patient states that she notices the symptoms worse when she exerts herself and when taking a shower.

## 2020-12-07 NOTE — ED Provider Notes (Addendum)
[facility]    CSN: [id number] Arrival date & time: 12/07/20  1440      History   Chief Complaint Chief Complaint  Patient presents with  . Appointment  . Shortness of Breath  . Tachycardia    HPI Nicole Phillips is a 45 y.o. female presenting for 1-2 day history of increased SOB and feeling like her heart is racing. She was diagnosed with COVID 19 about 2 weeks ago.  Patient says that she has been sick a lot over the past 3 to 4 weeks.  She states that she initially had a "stomach bug" at the beginning of January.  She says she has had diarrhea consistently over the past 3 weeks.  Patient states that she also had a UTI diagnosed at the beginning of January and was prescribed Macrobid.  She says that she went to see her PCP on 11/15/2020 and was prescribed Augmentin instead of the Macrobid for her UTI.  She took a full 10-day course.  She says that she started feeling little better and then ended up developing nasal congestion, cough, fatigue and feeling overall poorly.  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  Patient states that she was eventually tested for Covid on 11/29/2020 and the test was positive.  She believes that she was sick with Covid type symptoms about a week before that.  Admits to continued cough and congestion.  Admits to continued fatigue, diarrhea and feeling poorly overall.  She says that over the past couple of days she has felt little more short of breath than normal, but says it is not really that bad.  She says that she only feels short of breath if she is up and moving around, doing a lot of things.  Patient also says that she is aware that she is very obese and has a lot of medical problems and has been laying around since she has been sick.  Admits that she is not a very active person usually.  Not currently taking any medication for symptoms.  PMH significant for T2DM,  hypertension, hyperlipidemia, anxiety, and seizures. HTN controlled with lisinopril. Takes Lipitor for hyperlipidemia. T2DM treated with Actos, Metformin, and Trulicity. Seizures controlled with depakote. Takes Buspar for anxiety.   HPI  Past Medical History:  Diagnosis Date  . Diabetes mellitus without complication (HCC)   . Hypercholesteremia   . Hypertension     There are no problems to display for this patient.   Past Surgical History:  Procedure Laterality Date  . BACK SURGERY      OB History   No obstetric history on file.      Home Medications    Prior to Admission medications   Medication Sig Start Date End Date Taking? Authorizing Provider  albuterol (VENTOLIN HFA) 108 (90 Base) MCG/ACT inhaler Inhale 1-2 puffs into the lungs every 6 (six) hours as needed for wheezing or shortness of breath. 12/07/20 12/07/21 Yes Eusebio Friendly B, PA-C  atorvastatin (LIPITOR) 10 MG tablet Take 10 mg by mouth daily. 08/31/20  Yes [provider]  busPIRone (BUSPAR) 5 MG tablet TAKE 1 TABLET BY MOUTH EVERY DAY AT NIGHT 05/26/20  Yes [provider]  divalproex (DEPAKOTE) 500 MG DR tablet Take 500 mg by mouth 2 (two) times daily.   Yes [provider]  Dulaglutide (TRULICITY) 0.75 MG/0.5ML SOPN INJECT 0.5 MLS (0.75 MG TOTAL) SUBCUTANEOUSLY ONCE A WEEK  07/23/20  Yes [provider]  lisinopril (ZESTRIL) 10 MG tablet Take 1 tablet by mouth daily. 08/31/20  Yes [provider]  metFORMIN (GLUCOPHAGE) 500 MG tablet Take by mouth 2 (two) times daily with a meal.   Yes [provider]  pioglitazone (ACTOS) 30 MG tablet Take 30 mg by mouth daily.   Yes [provider]  fluconazole (DIFLUCAN) 150 MG tablet Take 1 tablet (150 mg total) by mouth daily. 11/12/20   Rodriguez-Southworth, Nettie Elm, PA-C  nitrofurantoin, macrocrystal-monohydrate, (MACROBID) 100 MG capsule Take 1 capsule (100 mg total) by mouth 2 (two) times daily. 11/12/20    Rodriguez-Southworth, Nettie Elm, PA-C    Family History Family History  Problem Relation Age of Onset  . Alzheimer's disease Mother   . Diabetes Mother   . Alzheimer's disease Father     Social History Social History   Tobacco Use  . Smoking status: Former Smoker    Quit date: 12/07/2010    Years since quitting: 10.0  . Smokeless tobacco: Never Used  Vaping Use  . Vaping Use: Never used  Substance Use Topics  . Alcohol use: No  . Drug use: No     Allergies   Sulfa antibiotics   Review of Systems Review of Systems  Constitutional: Positive for fatigue and fever (up to 100 degrees). Negative for chills and diaphoresis.  HENT: Positive for congestion. Negative for ear pain, rhinorrhea, sinus pressure, sinus pain and sore throat.   Respiratory: Positive for cough and shortness of breath.   Cardiovascular: Positive for palpitations. Negative for chest pain.  Gastrointestinal: Positive for vomiting. Negative for abdominal pain and nausea.  Genitourinary: Negative for difficulty urinating, hematuria and urgency.  Musculoskeletal: Negative for arthralgias and myalgias.  Skin: Negative for rash.  Neurological: Negative for weakness and headaches.  Hematological: Negative for adenopathy.  Psychiatric/Behavioral: The patient is nervous/anxious.      Physical Exam Triage Vital Signs ED Triage Vitals [12/07/20 1454]  Enc Vitals Group     BP 136/85     Pulse Rate 95     Resp 18     Temp 98.2 F (36.8 C)     Temp Source Oral     SpO2 100 %     Weight 244 lb (110.7 kg)     Height 5\' 9"  (1.753 m)     Head Circumference      Peak Flow      Pain Score 8     Pain Loc      Pain Edu?      Excl. in GC?    No data found.  Updated Vital Signs BP 136/85 (BP Location: Right Arm)   Pulse 95   Temp 98.2 F (36.8 C) (Oral)   Resp 18   Ht 5\' 9"  (1.753 m)   Wt 244 lb (110.7 kg)   LMP 11/30/2020 (Approximate)   SpO2 100%   BMI 36.03 kg/m       Physical Exam Vitals and  nursing note reviewed.  Constitutional:      General: She is not in acute distress.    Appearance: Normal appearance. She is obese. She is not ill-appearing, toxic-appearing or diaphoretic.  HENT:     Head: Normocephalic and atraumatic.     Nose: Nose normal.     Mouth/Throat:     Mouth: Mucous membranes are moist.     Pharynx: Oropharynx is clear.  Eyes:     General: No scleral icterus.  Right eye: No discharge.        Left eye: No discharge.     Conjunctiva/sclera: Conjunctivae normal.  Cardiovascular:     Rate and Rhythm: Normal rate and regular rhythm.     Heart sounds: Normal heart sounds. No murmur heard.   Pulmonary:     Effort: Pulmonary effort is normal. No respiratory distress.     Breath sounds: Normal breath sounds. No wheezing, rhonchi or rales.  Abdominal:     Palpations: Abdomen is soft.     Tenderness: There is no abdominal tenderness.  Musculoskeletal:     Cervical back: Neck supple.     Right lower leg: No edema.     Left lower leg: No edema.  Skin:    General: Skin is dry.  Neurological:     General: No focal deficit present.     Mental Status: She is alert. Mental status is at baseline.     Motor: No weakness.     Gait: Gait normal.  Psychiatric:        Mood and Affect: Mood is anxious.        Behavior: Behavior normal.        Thought Content: Thought content normal.      UC Treatments / Results  Labs (all labs ordered are listed, but only abnormal results are displayed) Labs Reviewed  GLUCOSE, CAPILLARY - Abnormal; Notable for the following components:      Result Value   Glucose-Capillary 227 (*)    All other components within normal limits  COMPREHENSIVE METABOLIC PANEL - Abnormal; Notable for the following components:   Glucose, Bld 253 (*)    Calcium 8.6 (*)    All other components within normal limits  CBC WITH DIFFERENTIAL/PLATELET  CBG MONITORING, ED    EKG   Radiology DG Chest 2 View  Result Date: 12/07/2020 CLINICAL  DATA:  Shortness of breath. EXAM: CHEST - 2 VIEW COMPARISON:  11/07/2005 FINDINGS: Patchy basilar airspace disease noted bilaterally, most prominently in the right middle lobe. Subtle nodularity seen over the left upper lung. No pulmonary edema or pleural effusion. The cardiopericardial silhouette is within normal limits for size. IMPRESSION: Patchy bibasilar airspace disease, most prominent in the right middle lobe, compatible with multifocal pneumonia. Nodular opacity over the left upper lung associated. Consider follow-up to ensure resolution. Electronically Signed   By: Kennith Center M.D.   On: 12/07/2020 15:42    Procedures ED EKG  Date/Time: 12/07/2020 3:14 PM Performed by: Shirlee Latch, PA-C Authorized by: Shirlee Latch, PA-C   ECG reviewed by ED Physician in the absence of a cardiologist: no   Previous ECG:    Previous ECG:  Unavailable Interpretation:    Interpretation: normal   Rate:    ECG rate:  86   ECG rate assessment: normal   Rhythm:    Rhythm: sinus rhythm   Ectopy:    Ectopy: none   QRS:    QRS axis:  Normal ST segments:    ST segments:  Normal T waves:    T waves: normal   Comments:     Normal sinus rhythm. Regular rate   (including critical care time)  Medications Ordered in UC Medications - No data to display  Initial Impression / Assessment and Plan / UC Course  I have reviewed the triage vital signs and the nursing notes.  Pertinent labs & imaging results that were available during my care of the patient were reviewed by me and  considered in my medical decision making (see chart for details).    45 year old female persisting COVID-19 symptoms.  Also admits to recent stomach bug and persistent diarrhea x3 to 4 weeks.  Concerns of elevated heart rate and feeling short of breath over the past couple days.  In the clinic all vital signs are normal and stable and patient is in no acute distress.  She is somewhat anxious on exam and does admit to that.   Her exam overall is benign.  Chest is clear to auscultation heart regular rate and rhythm.  EKG performed today shows normal sinus rhythm and regular rate.  Fingerstick glucose is 227.  A chest x-ray was performed which shows multifocal pneumonia consistent with COVID-19.  Independently reviewed chest x-ray.  CBC is within normal limits.  CMP only shows elevated glucose.  I did advise patient about getting stool studies since she has had ongoing diarrhea for 3 to 4 weeks and has been on multiple antibiotics.  Discussed that there are many causes for her diarrhea and she should consider testing for those infections including C. difficile.  Patient declines to give stool sample at this time.  Since she has been having a lot of diarrhea--admits to greater than 10 episodes per day sometimes--I did offer IV hydration, but she declines at this time and states that she is able to increase her liquids on her own.  Advised patient that she has Covid pneumonia.  She is already taken Augmentin and azithromycin which will cover bacterial causes but explained to her that this is a viral cause.  Suggested supportive care with increasing rest and fluids.  Also advised Mucinex.  Sent albuterol inhaler as needed for shortness of breath.  She does have a pulse ox at home to monitor her oxygen.  Discussed ED precautions with her and advised her to go to ED if she feels more short of breath or actions below 93%.  Advised her to follow-up with her PCP for repeat chest x-ray in the next couple weeks.  Advised following up sooner through telemedicine visit if possible.  Work note provided for the next couple days.  Thoroughly reviewed ED precautions with patient.   Final Clinical Impressions(s) / UC Diagnoses   Final diagnoses:  Pneumonia due to COVID-19 virus  Cough  Shortness of breath     Discharge Instructions     Your x-ray shows COVID pneumonia. This is treated with supportive care. Increase rest and fluids. Use  inhaler if you are feeling short of breath. Check O2 and if below 93% call EMS or go to ED. Can take Mucinex to help break up mucus.  COVID-19 INFECTION: The incubation period of COVID-19 is approximately 14 days after exposure, with most symptoms developing in roughly 4-5 days. Symptoms may range in severity from mild to critically severe. Roughly 80% of those infected will have mild symptoms. People of any age may become infected with COVID-19 and have the ability to transmit the virus. The most common symptoms include: fever, fatigue, cough, body aches, headaches, sore throat, nasal congestion, shortness of breath, nausea, vomiting, diarrhea, changes in smell and/or taste.    COURSE OF ILLNESS Some patients may begin with mild disease which can progress quickly into critical symptoms. If your symptoms are worsening please call ahead to the Emergency Department and proceed there for further treatment. Recovery time appears to be roughly 1-2 weeks for mild symptoms and 3-6 weeks for severe disease.   GO IMMEDIATELY TO ER FOR FEVER  YOU ARE UNABLE TO GET DOWN WITH TYLENOL, BREATHING PROBLEMS, CHEST PAIN, FATIGUE, LETHARGY, INABILITY TO EAT OR DRINK, ETC  QUARANTINE AND ISOLATION: To help decrease the spread of COVID-19 please remain isolated if you have COVID infection or are highly suspected to have COVID infection. This means -stay home and isolate to one room in the home if you live with others. Do not share a bed or bathroom with others while ill, sanitize and wipe down all countertops and keep common areas clean and disinfected. Stay home for 5 days. If you have no symptoms or your symptoms are resolving after 5 days, you can leave your house. Continue to wear a mask around others for 5 additional days. If you have been in close contact (within 6 feet) of someone diagnosed with COVID 19, you are advised to quarantine in your home for 14 days as symptoms can develop anywhere from 2-14 days after  exposure to the virus. If you develop symptoms, you  must isolate.  Most current guidelines for COVID after exposure -unvaccinated: isolate 5 days and strict mask use x 5 days. Test on day 5 is possible -vaccinated: wear mask x 10 days if symptoms do not develop -You do not necessarily need to be tested for COVID if you have + exposure and  develop symptoms. Just isolate at home x10 days from symptom onset During this global pandemic, CDC advises to practice social distancing, try to stay at least 28ft away from others at all times. Wear a face covering. Wash and sanitize your hands regularly and avoid going anywhere that is not necessary.  KEEP IN MIND THAT THE COVID TEST IS NOT 100% ACCURATE AND YOU SHOULD STILL DO EVERYTHING TO PREVENT POTENTIAL SPREAD OF VIRUS TO OTHERS (WEAR MASK, WEAR GLOVES, WASH HANDS AND SANITIZE REGULARLY). IF INITIAL TEST IS NEGATIVE, THIS MAY NOT MEAN YOU ARE DEFINITELY NEGATIVE. MOST ACCURATE TESTING IS DONE 5-7 DAYS AFTER EXPOSURE.   It is not advised by CDC to get re-tested after receiving a positive COVID test since you can still test positive for weeks to months after you have already cleared the virus.   *If you have not been vaccinated for COVID, I strongly suggest you consider getting vaccinated as long as there are no contraindications.      ED Prescriptions    Medication Sig Dispense Auth. Provider   albuterol (VENTOLIN HFA) 108 (90 Base) MCG/ACT inhaler Inhale 1-2 puffs into the lungs every 6 (six) hours as needed for wheezing or shortness of breath. 1 g Shirlee Latch, PA-C     PDMP not reviewed this encounter.   Shirlee Latch, PA-C 12/07/20 1812    Shirlee Latch, PA-C 12/10/20 [phone]

## 2020-12-31 IMAGING — MG DIGITAL SCREENING BILAT W/ TOMO W/ CAD
8 series · 8 of 24 positions shown · non-contrast
Comparison: None.

CLINICAL DATA: Screening.

EXAM:
DIGITAL SCREENING BILATERAL MAMMOGRAM WITH TOMO AND CAD

[L MLO synth-2D]
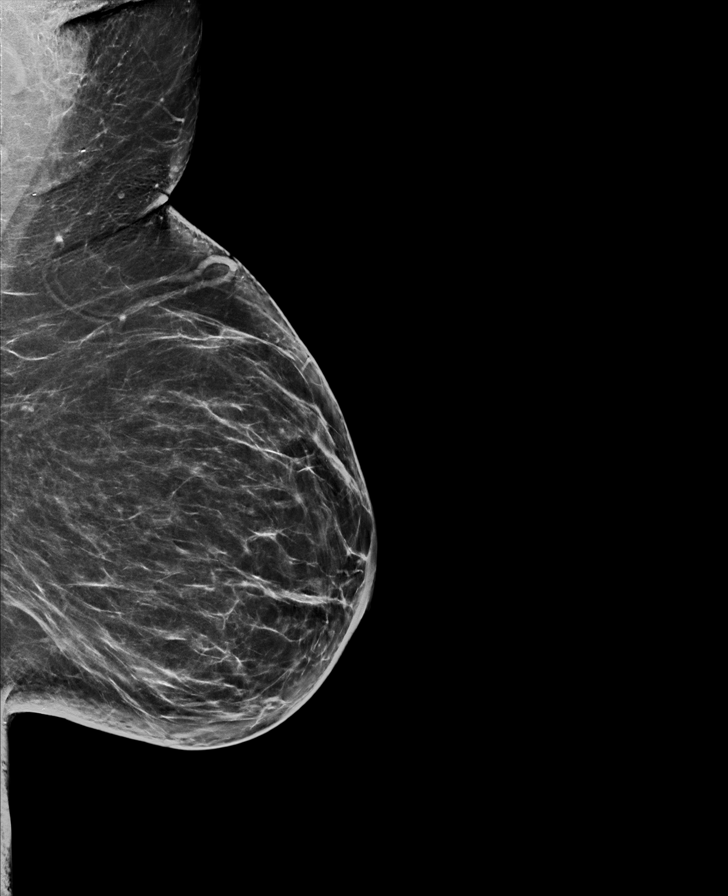

[R MLO synth-2D]
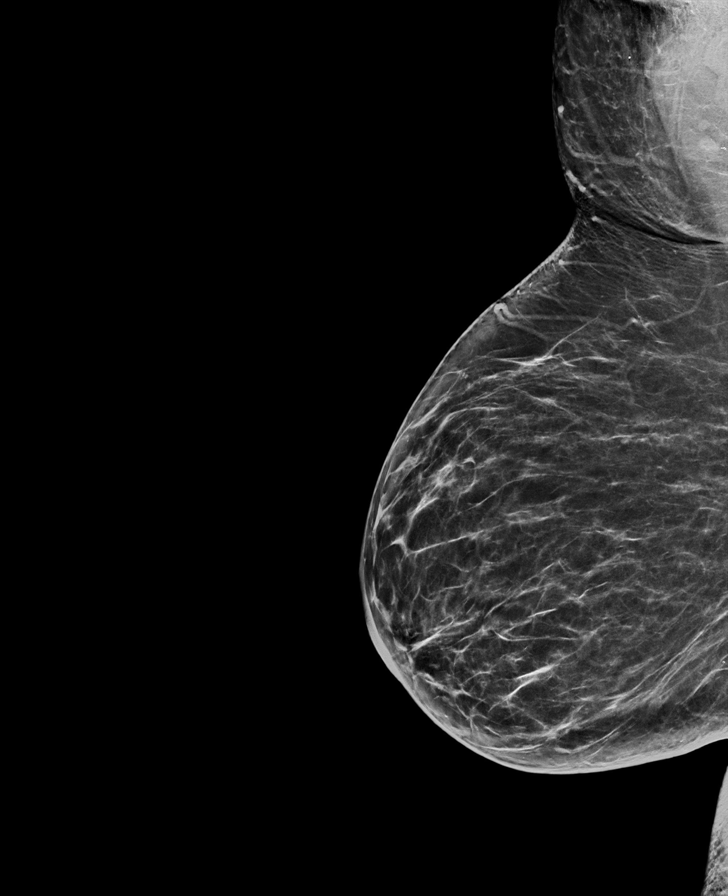

[L CC synth-2D]
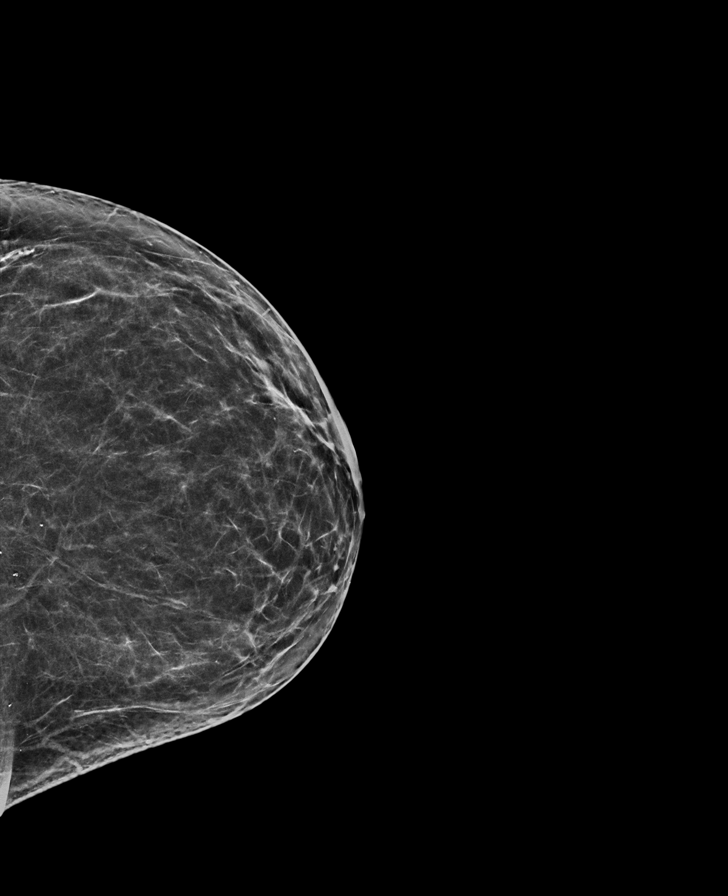

[R CC synth-2D]
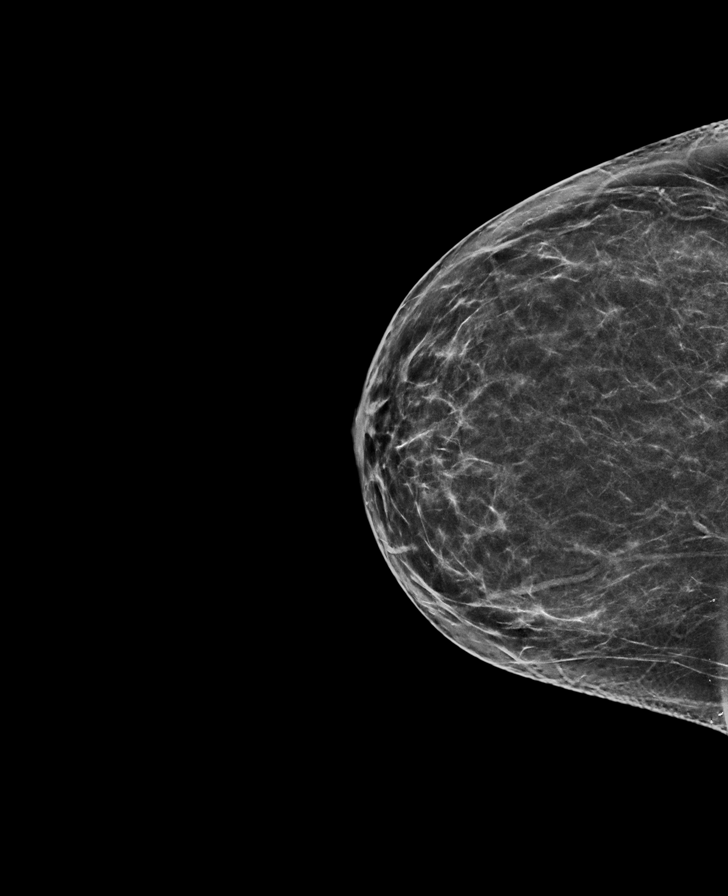

[L CC tomo · tomo slice 35/69.0]
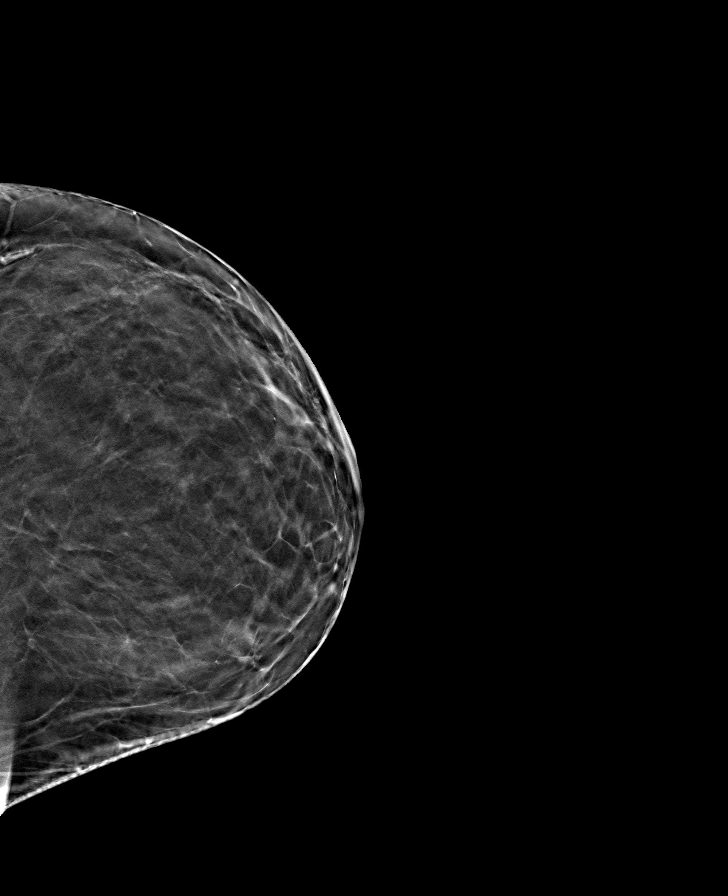

[L MLO tomo · tomo slice 41/80.0]
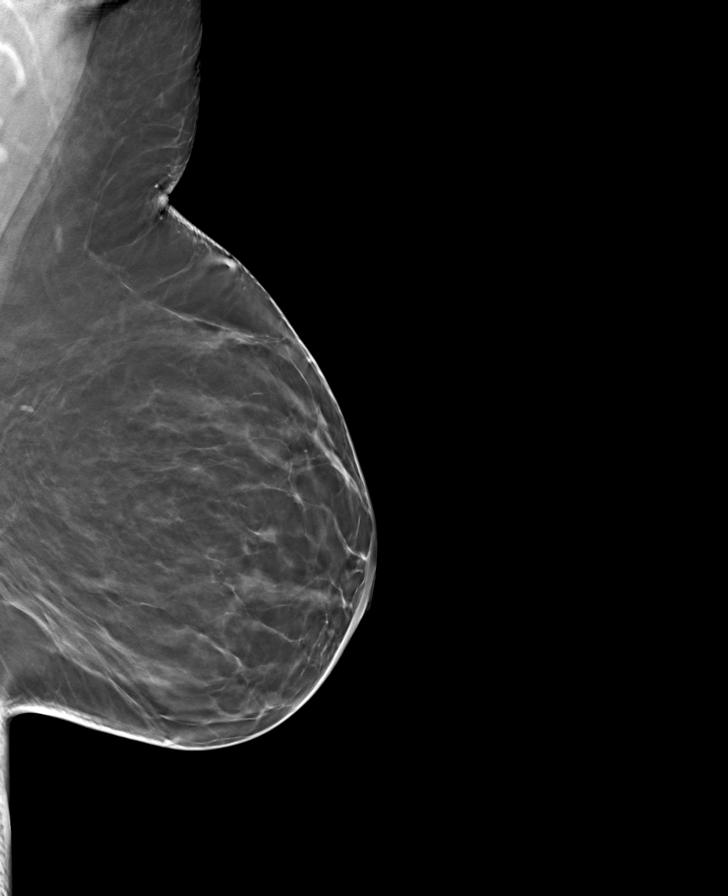

[R CC tomo · tomo slice 35/69.0]
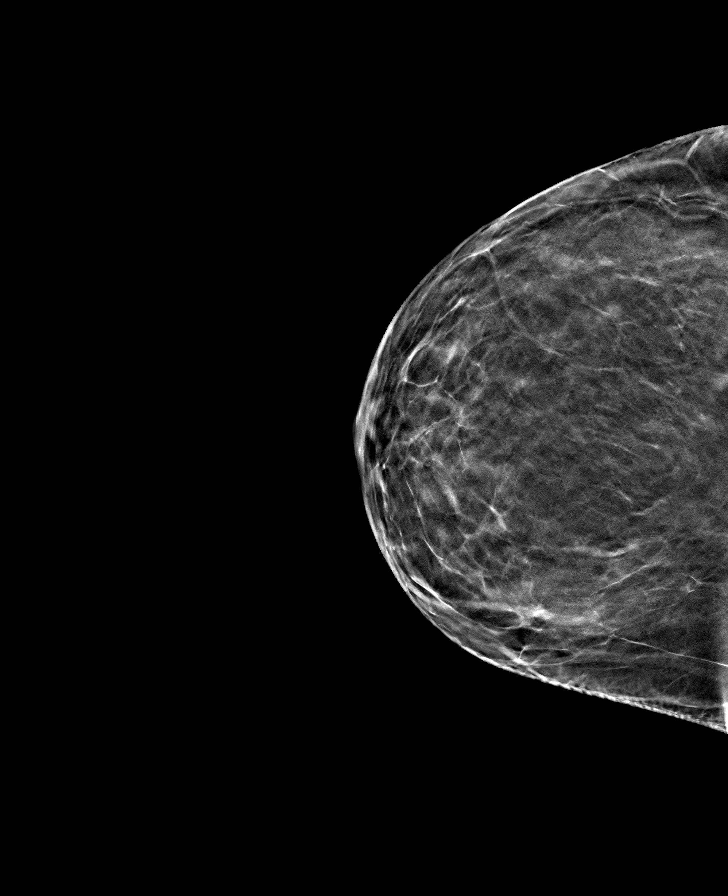

[R MLO tomo · tomo slice 39/76.0]
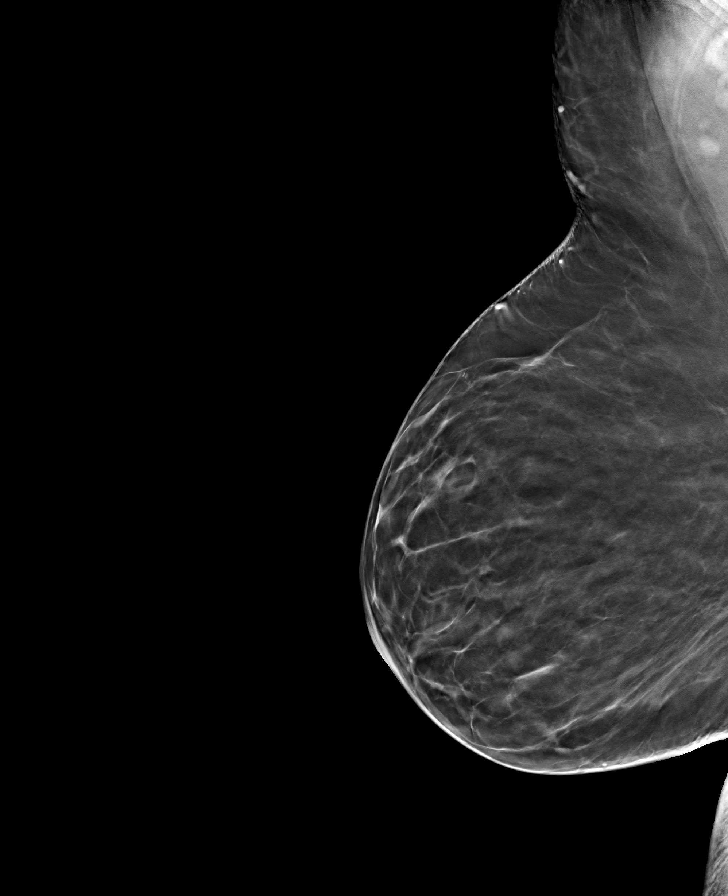

[8 of 24 positions shown; findings below may reference images not displayed]

ACR Breast Density Category b: There are scattered areas of
fibroglandular density.
FINDINGS: There are no findings suspicious for malignancy. Images were
processed with CAD.
IMPRESSION: No mammographic evidence of malignancy. A result letter of this
screening mammogram will be mailed directly to the patient.

RECOMMENDATION:
Screening mammogram in one year. (Code:Y5-G-EJ6)

BI-RADS CATEGORY  1: Negative.

## 2021-02-17 IMAGING — CR DG CHEST 2V
2 series · 2 of 2 positions shown · non-contrast
Comparison: 11/07/2005

CLINICAL DATA: Shortness of breath.

EXAM:
CHEST - 2 VIEW

[chest pa]
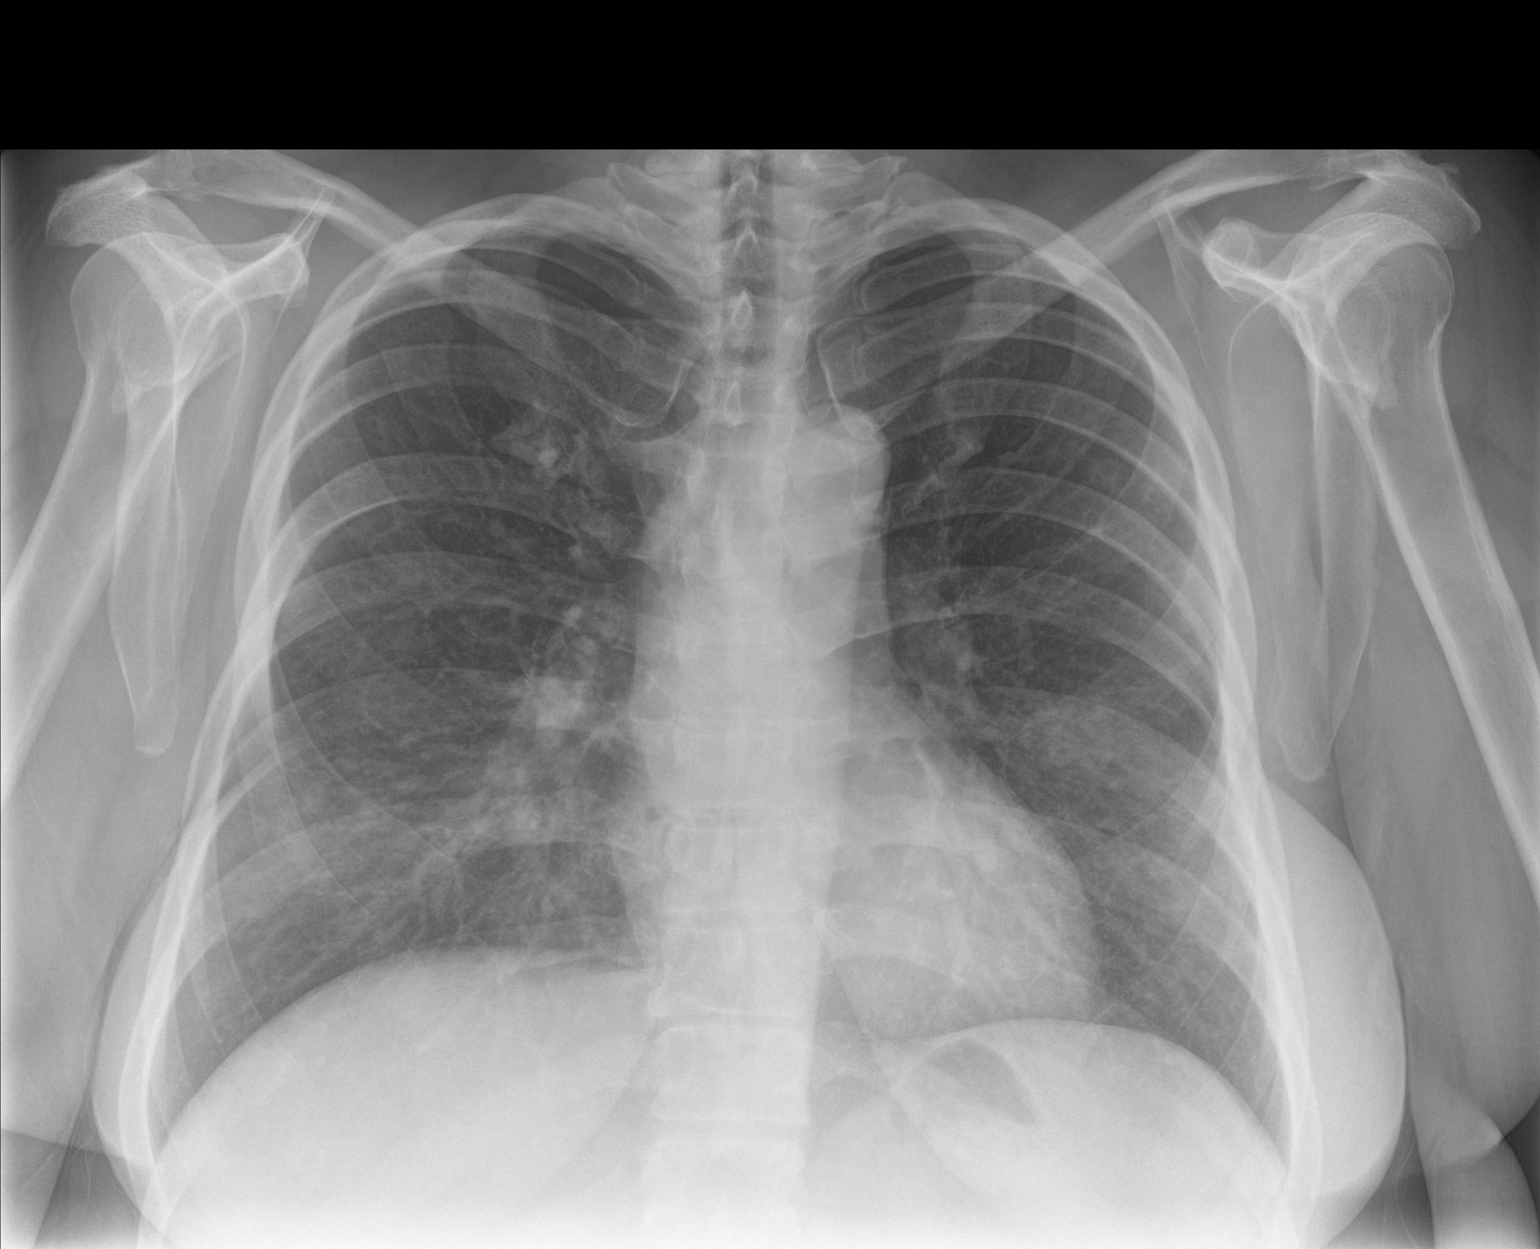

[chest lat]
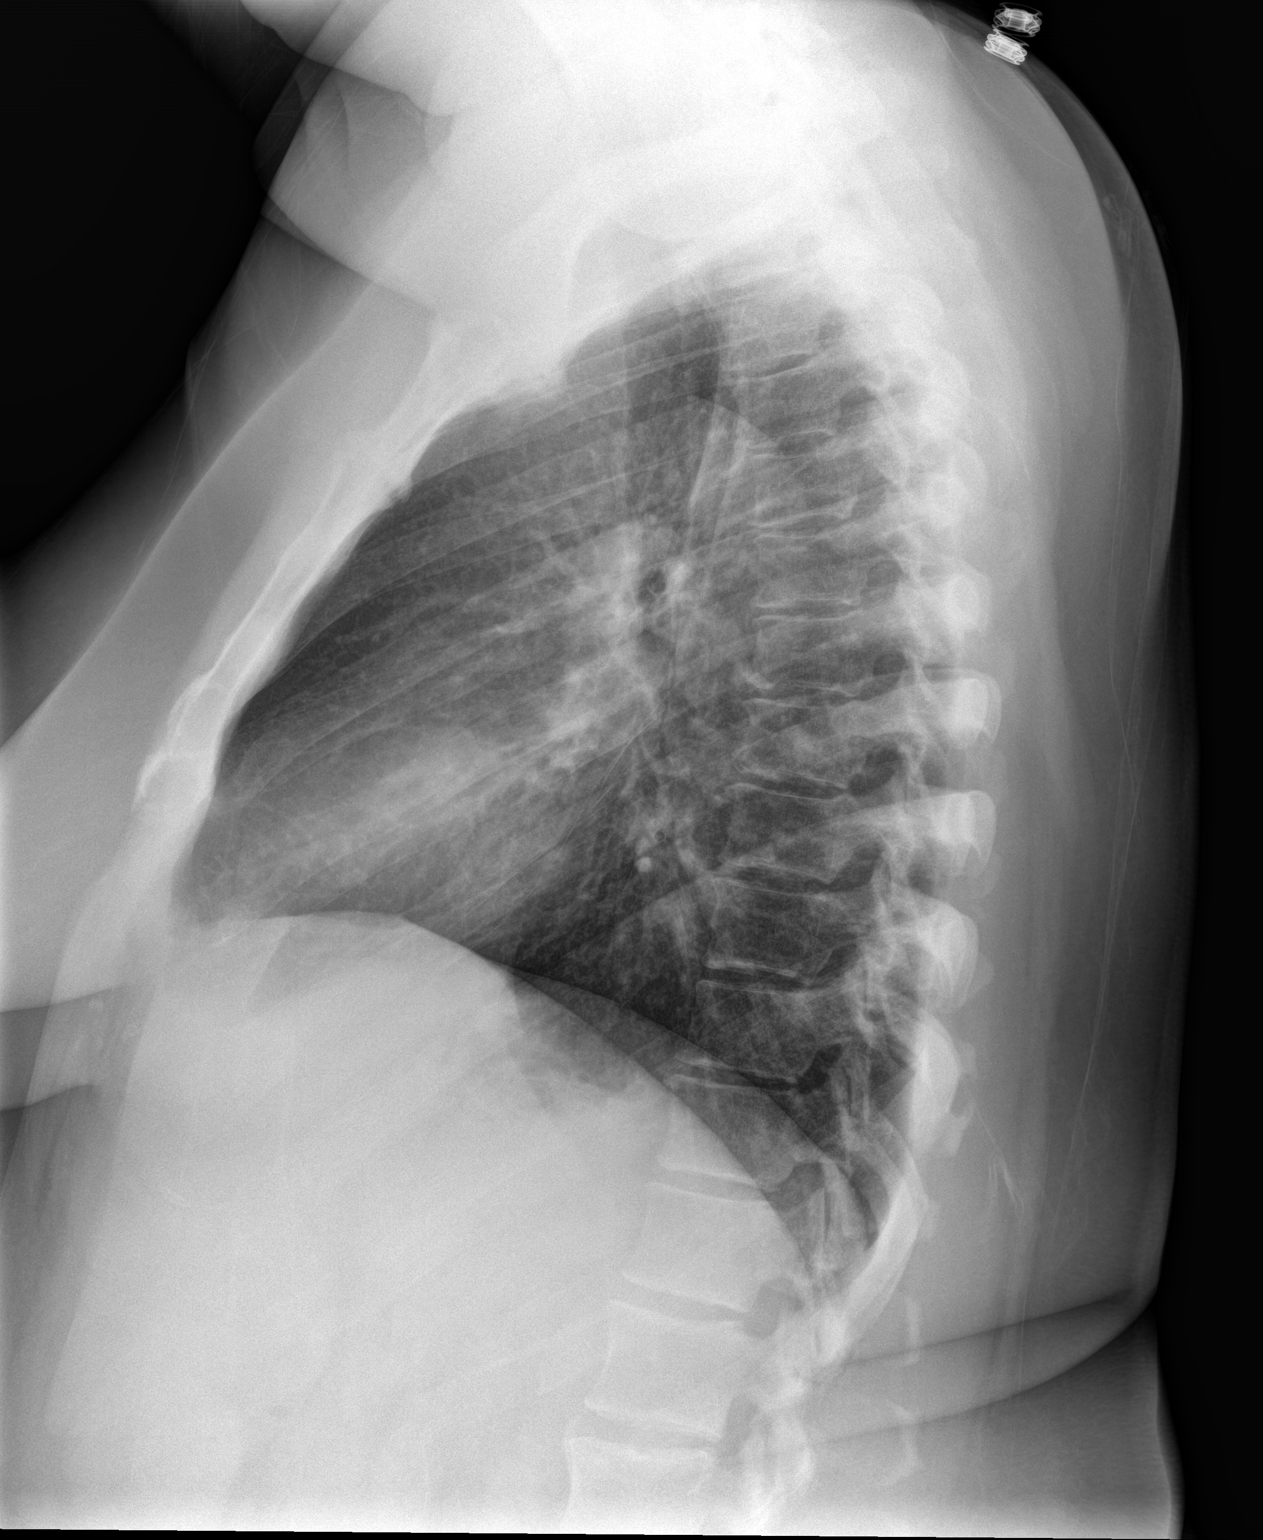

[2 of 2 positions shown; findings below may reference images not displayed]

FINDINGS: Patchy basilar airspace disease noted bilaterally, most prominently
in the right middle lobe. Subtle nodularity seen over the left upper
lung. No pulmonary edema or pleural effusion. The cardiopericardial
silhouette is within normal limits for size.
IMPRESSION: Patchy bibasilar airspace disease, most prominent in the right
middle lobe, compatible with multifocal pneumonia. Nodular opacity
over the left upper lung associated. Consider follow-up to ensure
resolution.

## 2021-02-28 ENCOUNTER — Encounter: Payer: Self-pay | Admitting: Emergency Medicine

## 2021-02-28 ENCOUNTER — Ambulatory Visit: Payer: Self-pay

## 2021-02-28 ENCOUNTER — Ambulatory Visit
Admission: EM | Admit: 2021-02-28 | Discharge: 2021-02-28 | Disposition: A | Discharge status: Home / Self Care | Payer: BC Managed Care – PPO | Attending: Emergency Medicine | Admitting: Emergency Medicine

## 2021-02-28 ENCOUNTER — Other Ambulatory Visit: Payer: Self-pay

## 2021-02-28 DIAGNOSIS — N390 Urinary tract infection, site not specified: Secondary | ICD-10-CM | POA: Diagnosis not present

## 2021-02-28 LAB — URINALYSIS, COMPLETE (UACMP) WITH MICROSCOPIC
Bilirubin Urine: NEGATIVE
Glucose, UA: >1000 mg/dL — AB
Hgb urine dipstick: NEGATIVE
Ketones, ur: NEGATIVE mg/dL
Leukocytes,Ua: NEGATIVE
Nitrite: NEGATIVE
Protein, ur: NEGATIVE mg/dL
Specific Gravity, Urine: >1.03 — ABNORMAL HIGH (ref 1.005–1.030)
pH: 5.5 (ref 5.0–8.0)

## 2021-02-28 LAB — PREGNANCY, URINE: Preg Test, Ur: NEGATIVE

## 2021-02-28 LAB — GLUCOSE, CAPILLARY: Glucose-Capillary: 259 mg/dL — ABNORMAL HIGH (ref 70–99)

## 2021-02-28 MED ORDER — CEPHALEXIN 500 MG PO CAPS: 500.0000 mg | ORAL_CAPSULE | Freq: Two times a day (BID) | ORAL | 0 refills | Status: AC

## 2021-02-28 NOTE — ED Triage Notes (Signed)
Pt states that she has urinary urgency and hematuria. Pt states that her sx started two days ago.

## 2021-02-28 NOTE — Discharge Instructions (Addendum)
Take medication as prescribed. Rest. Drink plenty of fluids (increase water).Monitor blood sugar.  Follow up with your primary care physician this week as needed. Return to Urgent care for new or worsening concerns.

## 2021-02-28 NOTE — ED Provider Notes (Signed)
[facility] ____________________________________________  Time seen: Approximately 5:38 PM  I have reviewed the triage vital signs and the nursing notes.   HISTORY  Chief Complaint Hematuria and Urinary Urgency   HPI Nicole Phillips is a 45 y.o. female past medical history of diabetes, hypertension, hypercholesterolemia, presenting for evaluation of possible UTI.  Patient reports she has a history of recurrent UTIs, and states that she does not have the typical symptoms of UTIs.  States for the last few days she has felt that her female area just does not feel right.  Denies urinary frequency, urgency, burning with urination.  Denies vaginal discharge, vaginal odor or vaginal pain.  Denies abdominal pain, back pain or flank pain.  Denies accompanying fevers.  Has continued to eat and drink well.  Patient is a diabetic, does not check her sugars daily.  Patient reports her blood sugars are usually in the 200s, she states that she knows she needs to eat better.  Also reports that she needs to increase her water intake.    Past Medical History:  Diagnosis Date  . Diabetes mellitus without complication (HCC)   . Hypercholesteremia   . Hypertension     There are no problems to display for this patient.   Past Surgical History:  Procedure Laterality Date  . BACK SURGERY       No current facility-administered medications for this encounter.  Current Outpatient Medications:  .  cephALEXin (KEFLEX) 500 MG capsule, Take 1 capsule (500 mg total) by mouth 2 (two) times daily for 7 days., Disp: 14 capsule, Rfl: 0 .  albuterol (VENTOLIN HFA) 108 (90 Base) MCG/ACT inhaler, Inhale 1-2 puffs into the lungs every 6 (six) hours as needed for wheezing or shortness of breath., Disp: 1 g, Rfl: 0 .  atorvastatin (LIPITOR) 10 MG tablet, Take 10 mg by mouth daily., Disp: , Rfl:  .  busPIRone (BUSPAR) 5 MG tablet, TAKE 1 TABLET BY MOUTH EVERY DAY AT NIGHT, Disp: , Rfl:  .  divalproex  (DEPAKOTE) 500 MG DR tablet, Take 500 mg by mouth 2 (two) times daily., Disp: , Rfl:  .  Dulaglutide (TRULICITY) 0.75 MG/0.5ML SOPN, INJECT 0.5 MLS (0.75 MG TOTAL) SUBCUTANEOUSLY ONCE A WEEK, Disp: , Rfl:  .  fluconazole (DIFLUCAN) 150 MG tablet, Take 1 tablet (150 mg total) by mouth daily., Disp: 2 tablet, Rfl: 0 .  lisinopril (ZESTRIL) 10 MG tablet, Take 1 tablet by mouth daily., Disp: , Rfl:  .  metFORMIN (GLUCOPHAGE) 500 MG tablet, Take by mouth 2 (two) times daily with a meal., Disp: , Rfl:  .  nitrofurantoin, macrocrystal-monohydrate, (MACROBID) 100 MG capsule, Take 1 capsule (100 mg total) by mouth 2 (two) times daily., Disp: 14 capsule, Rfl: 0 .  pioglitazone (ACTOS) 30 MG tablet, Take 30 mg by mouth daily., Disp: , Rfl:   Allergies Sulfa antibiotics  Family History  Problem Relation Age of Onset  . Alzheimer's disease Mother   . Diabetes Mother   . Alzheimer's disease Father     Social History Social History   Tobacco Use  . Smoking status: Former Smoker    Quit date: 12/07/2010    Years since quitting: 10.2  . Smokeless tobacco: Never Used  Vaping Use  . Vaping Use: Never used  Substance Use Topics  . Alcohol use: No  . Drug use: No    Review of Systems Constitutional: No fever ENT: No sore throat. Cardiovascular: Denies chest pain. Respiratory: Denies shortness of breath. Gastrointestinal: No  abdominal pain.  No nausea, no vomiting.  No diarrhea.  No constipation. Genitourinary: As above. Musculoskeletal: Negative for back pain. Skin: Negative for rash.   ____________________________________________   PHYSICAL EXAM:  VITAL SIGNS: ED Triage Vitals  Enc Vitals Group     BP 02/28/21 1622 128/76     Pulse Rate 02/28/21 1622 83     Resp 02/28/21 1622 18     Temp 02/28/21 1622 99 F (37.2 C)     Temp Source 02/28/21 1622 Oral     SpO2 02/28/21 1622 100 %     Weight --      Height --      Head Circumference --      Peak Flow --      Pain Score 02/28/21  1730 0     Pain Loc --      Pain Edu? --      Excl. in GC? --     Constitutional: Alert and oriented. Well appearing and in no acute distress. Eyes: Conjunctivae are normal.  ENT      Head: Normocephalic and atraumatic. Respiratory: Normal respiratory effort without tachypnea nor retractions.  Gastrointestinal: Soft and nontender. No CVA tenderness. Musculoskeletal: Steady gait. Neurologic:  Normal speech and language.  Skin:  Skin is warm, dry and intact. No rash noted. Psychiatric: Mood and affect are normal. Speech and behavior are normal. Patient exhibits appropriate insight and judgment   ___________________________________________   LABS (all labs ordered are listed, but only abnormal results are displayed)  Labs Reviewed  URINALYSIS, COMPLETE (UACMP) WITH MICROSCOPIC - Abnormal; Notable for the following components:      Result Value   APPearance HAZY (*)    Specific Gravity, Urine >1.030 (*)    Glucose, UA >1,000 (*)    Bacteria, UA FEW (*)    All other components within normal limits  GLUCOSE, CAPILLARY - Abnormal; Notable for the following components:   Glucose-Capillary 259 (*)    All other components within normal limits  URINE CULTURE  PREGNANCY, URINE   ____________________________________________   PROCEDURES Procedures   INITIAL IMPRESSION / ASSESSMENT AND PLAN / ED COURSE  Pertinent labs & imaging results that were available during my care of the patient were reviewed by me and considered in my medical decision making (see chart for details).  Is a well-appearing patient.  No acute distress.  Urine pregnancy negative.  Urinalysis reviewed, concerning for urinary tract infection.  We will culture and empirically start on oral Keflex.  Glucose 259, which patient reports is consistent for her.  Strongly encouraged patient to monitor diet, check sugar as normally and increase water intake.Discussed indication, risks and benefits of medications with patient.    Discussed follow up with Primary care physician this week. Discussed follow up and return parameters including no resolution or any worsening concerns. Patient verbalized understanding and agreed to plan.   ____________________________________________   FINAL CLINICAL IMPRESSION(S) / ED DIAGNOSES  Final diagnoses:  Urinary tract infection without hematuria, site unspecified     ED Discharge Orders         Ordered    cephALEXin (KEFLEX) 500 MG capsule  2 times daily        02/28/21 1710           Note: This dictation was prepared with Dragon dictation along with smaller phrase technology. Any transcriptional errors that result from this process are unintentional.         Renford Dills, NP 02/28/21 1742

## 2021-03-02 LAB — URINE CULTURE

## 2021-05-30 ENCOUNTER — Other Ambulatory Visit: Payer: Self-pay

## 2021-05-30 ENCOUNTER — Ambulatory Visit
Admission: RE | Admit: 2021-05-30 | Discharge: 2021-05-30 | Disposition: A | Discharge status: Home / Self Care | Payer: BC Managed Care – PPO | Source: Ambulatory Visit | Attending: Family Medicine | Admitting: Family Medicine

## 2021-05-30 VITALS — BP 123/75 | HR 76 | Temp 98.2°F | Resp 18 | Ht 69.0 in | Wt 247.0 lb

## 2021-05-30 DIAGNOSIS — R202 Paresthesia of skin: Secondary | ICD-10-CM

## 2021-05-30 DIAGNOSIS — R200 Anesthesia of skin: Secondary | ICD-10-CM

## 2021-05-30 DIAGNOSIS — M792 Neuralgia and neuritis, unspecified: Secondary | ICD-10-CM

## 2021-05-30 MED ORDER — GABAPENTIN 100 MG PO CAPS: 100.0000 mg | ORAL_CAPSULE | Freq: Three times a day (TID) | ORAL | 1 refills | Status: AC

## 2021-05-30 NOTE — ED Provider Notes (Addendum)
[facility]    CSN: [id number] Arrival date & time: 05/30/21  1250      History   Chief Complaint Chief Complaint  Patient presents with   Arm Pain    left    HPI  45 year old female with uncontrolled type 2 diabetes, hypertension, hyperlipidemia, obesity presents with the above complaint.  Patient reports a 2-week history of symptoms.  Patient states that she told her primary care provider about this but it had only been going on for few days and she was advised watchful waiting.  Patient states that her pain has continued to persist and is quite troublesome.  She states that she is experiencing left forearm pain and numbness and tingling of her third and fourth digits.  She states that the pain is 10/10 in severity.  Patient states that she has a history of cervical issues.  Patient states that this started after her significant other massaged her forearm.  She states that she "felt a pop".  She has been taking ibuprofen without relief.  Past Medical History:  Diagnosis Date   Diabetes mellitus without complication (HCC)    Hypercholesteremia    Hypertension    Past Surgical History:  Procedure Laterality Date   BACK SURGERY      OB History   No obstetric history on file.      Home Medications    Prior to Admission medications   Medication Sig Start Date End Date Taking? Authorizing Provider  albuterol (VENTOLIN HFA) 108 (90 Base) MCG/ACT inhaler Inhale 1-2 puffs into the lungs every 6 (six) hours as needed for wheezing or shortness of breath. 12/07/20 12/07/21 Yes Eusebio Friendly B, PA-C  atorvastatin (LIPITOR) 10 MG tablet Take 10 mg by mouth daily. 08/31/20  Yes [provider]  busPIRone (BUSPAR) 5 MG tablet Take by mouth. 05/20/21  Yes [provider]  divalproex (DEPAKOTE ER) 500 MG 24 hr tablet Take 1,000 mg by mouth at bedtime. 05/24/21  Yes [provider]  Dulaglutide (TRULICITY) 0.75 MG/0.5ML SOPN INJECT 0.5 MLS (0.75 MG  TOTAL) SUBCUTANEOUSLY ONCE A WEEK 07/23/20  Yes [provider]  gabapentin (NEURONTIN) 100 MG capsule Take 1 capsule (100 mg total) by mouth 3 (three) times daily. After 1 week, can increase to 300 mg three times daily. 05/30/21  Yes Keyerra Lamere G, DO  lisinopril (ZESTRIL) 10 MG tablet Take 1 tablet by mouth daily. 08/31/20  Yes [provider]  metFORMIN (GLUCOPHAGE-XR) 500 MG 24 hr tablet Take 500 mg by mouth 2 (two) times daily. 05/18/21  Yes [provider]  pioglitazone (ACTOS) 30 MG tablet Take 30 mg by mouth daily.   Yes [provider]    Family History Family History  Problem Relation Age of Onset   Alzheimer's disease Mother    Diabetes Mother    Alzheimer's disease Father     Social History Social History   Tobacco Use   Smoking status: Former    Types: Cigarettes    Quit date: 12/07/2010    Years since quitting: 10.4   Smokeless tobacco: Never  Vaping Use   Vaping Use: Never used  Substance Use Topics   Alcohol use: No   Drug use: No     Allergies   Sulfa antibiotics   Review of Systems Review of Systems Per HPI  Physical Exam Triage Vital Signs ED Triage Vitals  Enc Vitals Group     BP 05/30/21 1314 123/75     Pulse Rate 05/30/21  1314 76     Resp 05/30/21 1314 18     Temp 05/30/21 1314 98.2 F (36.8 C)     Temp Source 05/30/21 1314 Oral     SpO2 05/30/21 1314 100 %     Weight 05/30/21 1310 247 lb (112 kg)     Height 05/30/21 1310 5\' 9"  (1.753 m)     Head Circumference --      Peak Flow --      Pain Score 05/30/21 1310 10     Pain Loc --      Pain Edu? --      Excl. in GC? --    Updated Vital Signs BP 123/75 (BP Location: Left Arm)   Pulse 76   Temp 98.2 F (36.8 C) (Oral)   Resp 18   Ht 5\' 9"  (1.753 m)   Wt 112 kg   LMP 05/16/2021 (Approximate)   SpO2 100%   BMI 36.48 kg/m   Visual Acuity Right Eye Distance:   Left Eye Distance:   Bilateral Distance:    Right Eye Near:   Left Eye Near:     Bilateral Near:     Physical Exam Vitals and nursing note reviewed.  Constitutional:      General: She is not in acute distress.    Appearance: Normal appearance. She is obese. She is not ill-appearing.  HENT:     Head: Normocephalic and atraumatic.  Eyes:     General:        Right eye: No discharge.        Left eye: No discharge.     Conjunctiva/sclera: Conjunctivae normal.  Pulmonary:     Effort: Pulmonary effort is normal. No respiratory distress.  Musculoskeletal:     Comments: Left forearm - no appreciable swelling.  Left elbow - mild tenderness to palpation.  Neurological:     Mental Status: She is alert.  Psychiatric:        Mood and Affect: Mood normal.        Behavior: Behavior normal.     UC Treatments / Results  Labs (all labs ordered are listed, but only abnormal results are displayed) Labs Reviewed - No data to display  EKG   Radiology No results found.  Procedures Procedures (including critical care time)  Medications Ordered in UC Medications - No data to display  Initial Impression / Assessment and Plan / UC Course  I have reviewed the triage vital signs and the nursing notes.  Pertinent labs & imaging results that were available during my care of the patient were reviewed by me and considered in my medical decision making (see chart for details).    45 year old female presents with numbness and paresthesias.  She is experiencing neuropathic pain.  It is unclear whether this is coming from the cervical spine or from nerve compression peripherally.  I advised the patient that she needs to follow-up with her primary care provider to discuss MRI imaging versus nerve conduction study versus referral.  Patient was prescribed gabapentin for symptomatic treatment.  Final Clinical Impressions(s) / UC Diagnoses   Final diagnoses:  Numbness and tingling of hand  Neuropathic pain     Discharge Instructions      Medication as directed.  Follow up  with your PCP to discuss nerve conduction study vs MRI of the neck (or possibly a referral).  Take care  Dr. 07/17/2021    ED Prescriptions     Medication Sig Dispense Auth. Provider  gabapentin (NEURONTIN) 100 MG capsule Take 1 capsule (100 mg total) by mouth 3 (three) times daily. After 1 week, can increase to 300 mg three times daily. 90 capsule Everlene Other G, DO      PDMP not reviewed this encounter.    Tommie Sams, [place] 05/30/21 1901

## 2021-05-30 NOTE — ED Triage Notes (Signed)
Pt c/o pain to her left forearm for the past 2 weeks. Pt states her husband was rubbing her arm and something popped. She reports numbness and tingling down her arm into her fingers. She also reports she has been taking ibuprofen and it has not improved. Pt does have full ROM to the arm, elbow and hand/fingers.

## 2021-05-30 NOTE — Discharge Instructions (Signed)
Medication as directed.  Follow up with your PCP to discuss nerve conduction study vs MRI of the neck (or possibly a referral).  Take care  Dr. Adriana Simas

## 2023-01-19 ENCOUNTER — Other Ambulatory Visit: Payer: Self-pay | Admitting: Nurse Practitioner

## 2023-01-19 DIAGNOSIS — Z1231 Encounter for screening mammogram for malignant neoplasm of breast: Secondary | ICD-10-CM

## 2023-05-12 ENCOUNTER — Ambulatory Visit
Admission: EM | Admit: 2023-05-12 | Discharge: 2023-05-12 | Disposition: A | Discharge status: Home / Self Care | Payer: BC Managed Care – PPO

## 2023-05-12 DIAGNOSIS — U071 COVID-19: Secondary | ICD-10-CM | POA: Diagnosis not present

## 2023-05-12 DIAGNOSIS — R051 Acute cough: Secondary | ICD-10-CM | POA: Diagnosis not present

## 2023-05-12 MED ORDER — BENZONATATE 200 MG PO CAPS: 200.0000 mg | ORAL_CAPSULE | Freq: Three times a day (TID) | ORAL | 0 refills | Status: AC | PRN

## 2023-05-12 MED ORDER — PROMETHAZINE-DM 6.25-15 MG/5ML PO SYRP: 5.0000 mL | ORAL_SOLUTION | Freq: Three times a day (TID) | ORAL | 0 refills | Status: AC | PRN

## 2023-05-12 MED ORDER — IPRATROPIUM BROMIDE 0.06 % NA SOLN: 2.0000 | Freq: Four times a day (QID) | NASAL | 0 refills | Status: AC

## 2023-05-12 NOTE — Discharge Instructions (Addendum)
-  The newest guidelines for COVID are that you should isolate until you are fever free for 24 hours and symptoms are improving. - I sent cough medication to the pharmacy for you and a nasal spray.  Increase rest and fluids. - If you have a return of the fever or start to feel short of breath or have increased weakness please return or go to the ER.

## 2023-05-12 NOTE — ED Provider Notes (Signed)
[facility]    CSN: [id number] Arrival date & time: 05/12/23  0847      History   Chief Complaint Chief Complaint  Patient presents with   Covid Positive    HPI Nicole Phillips is a 47 y.o. female with history of diabetes, hypertension and hyperlipidemia.  She presents today for 4-day history of fatigue, cough, congestion, sinus pressure, sore throat and bodyaches.  Reports fever to 102 degrees which resolved 2 days ago.  Patient reports she tested positive for COVID yesterday.  States her symptoms have improved from onset but she still does not feel well.  Mostly dry cough with occasional clear phlegm production.  Patient reports she was seen here in 2022 and ended up having a severe case of COVID with COVID-pneumonia.  She is fearful that could happen again.  She denies any history of asthma, COPD and does not currently smoke.  Has not been taking any OTC meds for symptoms.  No other complaints.  HPI  Past Medical History:  Diagnosis Date   Diabetes mellitus without complication (HCC)    Hypercholesteremia    Hypertension     There are no problems to display for this patient.   Past Surgical History:  Procedure Laterality Date   BACK SURGERY      OB History   No obstetric history on file.      Home Medications    Prior to Admission medications   Medication Sig Start Date End Date Taking? Authorizing Provider  benzonatate (TESSALON) 200 MG capsule Take 1 capsule (200 mg total) by mouth 3 (three) times daily as needed for cough. 05/12/23  Yes Eusebio Friendly B, PA-C  Continuous Glucose Sensor (FREESTYLE LIBRE 3 SENSOR) MISC Apply topically every 14 (fourteen) days. 02/25/23  Yes [provider]  ipratropium (ATROVENT) 0.06 % nasal spray Place 2 sprays into both nostrils 4 (four) times daily. 05/12/23  Yes Shirlee Latch, PA-C  LANTUS SOLOSTAR 100 UNIT/ML Solostar Pen Inject up to 30 units nightly, as directed 11/13/22  Yes [provider]   promethazine-dextromethorphan (PROMETHAZINE-DM) 6.25-15 MG/5ML syrup Take 5 mLs by mouth 3 (three) times daily as needed for cough. 05/12/23  Yes Shirlee Latch, PA-C  albuterol (VENTOLIN HFA) 108 (90 Base) MCG/ACT inhaler Inhale 1-2 puffs into the lungs every 6 (six) hours as needed for wheezing or shortness of breath. 12/07/20 12/07/21  Eusebio Friendly B, PA-C  atorvastatin (LIPITOR) 10 MG tablet Take 10 mg by mouth daily. 08/31/20   [provider]  busPIRone (BUSPAR) 5 MG tablet Take by mouth. 05/20/21   [provider]  divalproex (DEPAKOTE ER) 500 MG 24 hr tablet Take 1,000 mg by mouth at bedtime. 05/24/21   [provider]  Dulaglutide (TRULICITY) 0.75 MG/0.5ML SOPN INJECT 0.5 MLS (0.75 MG TOTAL) SUBCUTANEOUSLY ONCE A WEEK 07/23/20   [provider]  gabapentin (NEURONTIN) 100 MG capsule Take 1 capsule (100 mg total) by mouth 3 (three) times daily. After 1 week, can increase to 300 mg three times daily. 05/30/21   Tommie Sams, DO  lisinopril (ZESTRIL) 10 MG tablet Take 1 tablet by mouth daily. 08/31/20   [provider]  metFORMIN (GLUCOPHAGE-XR) 500 MG 24 hr tablet Take 500 mg by mouth 2 (two) times daily. 05/18/21   [provider]  pioglitazone (ACTOS) 30 MG tablet Take 30 mg by mouth daily.    [provider]    Family History Family History  Problem Relation Age of Onset  Alzheimer's disease Mother    Diabetes Mother    Alzheimer's disease Father     Social History Social History   Tobacco Use   Smoking status: Former    Types: Cigarettes    Quit date: 12/07/2010    Years since quitting: 12.4   Smokeless tobacco: Never  Vaping Use   Vaping Use: Never used  Substance Use Topics   Alcohol use: No   Drug use: No     Allergies   Sulfa antibiotics   Review of Systems Review of Systems  Constitutional:  Positive for fatigue. Negative for chills, diaphoresis and fever.  HENT:  Positive for congestion, rhinorrhea,  sinus pressure and sore throat. Negative for ear pain and sinus pain.   Respiratory:  Positive for cough. Negative for shortness of breath.   Gastrointestinal:  Negative for abdominal pain, nausea and vomiting.  Musculoskeletal:  Positive for myalgias.  Skin:  Negative for rash.  Neurological:  Negative for weakness and headaches.  Hematological:  Negative for adenopathy.     Physical Exam Triage Vital Signs ED Triage Vitals  Enc Vitals Group     BP 05/12/23 0913 111/78     Pulse Rate 05/12/23 0913 79     Resp 05/12/23 0913 18     Temp 05/12/23 0913 98.6 F (37 C)     Temp Source 05/12/23 0913 Oral     SpO2 05/12/23 0913 97 %     Weight 05/12/23 0910 268 lb (121.6 kg)     Height 05/12/23 0910 5\' 9"  (1.753 m)     Head Circumference --      Peak Flow --      Pain Score 05/12/23 0910 0     Pain Loc --      Pain Edu? --      Excl. in GC? --    No data found.  Updated Vital Signs BP 111/78 (BP Location: Left Arm)   Pulse 79   Temp 98.6 F (37 C) (Oral)   Resp 18   Ht 5\' 9"  (1.753 m)   Wt 268 lb (121.6 kg)   LMP 05/03/2023   SpO2 97%   BMI 39.58 kg/m   Physical Exam Vitals and nursing note reviewed.  Constitutional:      General: She is not in acute distress.    Appearance: Normal appearance. She is not ill-appearing or toxic-appearing.  HENT:     Head: Normocephalic and atraumatic.     Nose: Congestion present.     Mouth/Throat:     Mouth: Mucous membranes are moist.     Pharynx: Oropharynx is clear. Posterior oropharyngeal erythema present.  Eyes:     General: No scleral icterus.       Right eye: No discharge.        Left eye: No discharge.     Conjunctiva/sclera: Conjunctivae normal.  Cardiovascular:     Rate and Rhythm: Normal rate and regular rhythm.     Heart sounds: Normal heart sounds.  Pulmonary:     Effort: Pulmonary effort is normal. No respiratory distress.     Breath sounds: Normal breath sounds. No wheezing, rhonchi or rales.   Musculoskeletal:     Cervical back: Neck supple.  Skin:    General: Skin is dry.  Neurological:     General: No focal deficit present.     Mental Status: She is alert. Mental status is at baseline.     Motor: No weakness.     Gait: Gait  normal.  Psychiatric:        Mood and Affect: Mood normal.        Behavior: Behavior normal.        Thought Content: Thought content normal.      UC Treatments / Results  Labs (all labs ordered are listed, but only abnormal results are displayed) Labs Reviewed - No data to display  EKG   Radiology No results found.  Procedures Procedures (including critical care time)  Medications Ordered in UC Medications - No data to display  Initial Impression / Assessment and Plan / UC Course  I have reviewed the triage vital signs and the nursing notes.  Pertinent labs & imaging results that were available during my care of the patient were reviewed by me and considered in my medical decision making (see chart for details).   47 year old female presents for fatigue, cough, congestion, sore throat x 4 days.  Positive home COVID test yesterday.  Vitals are all normal and stable and she is overall well-appearing.  On exam she has nasal congestion.  Mild posterior pharyngeal injection.  Chest clear.  Discussed current CDC guidelines, isolation protocol and ED precautions with patient.  No suspicion for pneumonia at this time but we did review returning for any return of the fever, chest pain, shortness of breath, increased weakness.  Sent benzonatate, Atrovent nasal spray and Promethazine DM to pharmacy.   Final Clinical Impressions(s) / UC Diagnoses   Final diagnoses:  COVID-19  Acute cough     Discharge Instructions      -The newest guidelines for COVID are that you should isolate until you are fever free for 24 hours and symptoms are improving. - I sent cough medication to the pharmacy for you and a nasal spray.  Increase rest and  fluids. - If you have a return of the fever or start to feel short of breath or have increased weakness please return or go to the ER.    ED Prescriptions     Medication Sig Dispense Auth. Provider   benzonatate (TESSALON) 200 MG capsule Take 1 capsule (200 mg total) by mouth 3 (three) times daily as needed for cough. 20 capsule Eusebio Friendly B, PA-C   ipratropium (ATROVENT) 0.06 % nasal spray Place 2 sprays into both nostrils 4 (four) times daily. 15 mL Eusebio Friendly B, PA-C   promethazine-dextromethorphan (PROMETHAZINE-DM) 6.25-15 MG/5ML syrup Take 5 mLs by mouth 3 (three) times daily as needed for cough. 118 mL Shirlee Latch, PA-C      PDMP not reviewed this encounter.   Eusebio Friendly B, PA-C 05/12/23 [phone]

## 2023-05-12 NOTE — ED Triage Notes (Signed)
Pt reports getting sick on 7/2. Started with sinus pain, sore throat, body aches, fever. Now just has a cough. Took COVID test yesterday and was positive. Cough is worse at night. Clear phlegm. No fever now and pt concerned over getting pneumonia again.

## 2023-05-13 ENCOUNTER — Ambulatory Visit: Payer: Self-pay

## 2023-09-09 ENCOUNTER — Ambulatory Visit: Payer: Self-pay

## 2024-08-08 ENCOUNTER — Other Ambulatory Visit: Payer: Self-pay | Admitting: Gerontology

## 2024-08-08 DIAGNOSIS — Z1231 Encounter for screening mammogram for malignant neoplasm of breast: Secondary | ICD-10-CM
